# Patient Record
Sex: Female | Born: 1985
Health system: Southern US, Community
[De-identification: ages and names within clinical notes are randomized; demographics above are authoritative.]

## PROBLEM LIST (undated history)

## (undated) DIAGNOSIS — O24419 Gestational diabetes mellitus in pregnancy, unspecified control: Secondary | ICD-10-CM

## (undated) DIAGNOSIS — Z8619 Personal history of other infectious and parasitic diseases: Secondary | ICD-10-CM

## (undated) HISTORY — DX: Gestational diabetes mellitus in pregnancy, unspecified control: O24.419

## (undated) HISTORY — DX: Personal history of other infectious and parasitic diseases: Z86.19

---

## 2015-07-08 ENCOUNTER — Ambulatory Visit (INDEPENDENT_AMBULATORY_CARE_PROVIDER_SITE_OTHER): Payer: BLUE CROSS/BLUE SHIELD | Admitting: Physician Assistant

## 2015-07-08 VITALS — BP 112/80 | HR 82 | Temp 98.7°F | Resp 20 | Ht 65.0 in | Wt 129.2 lb

## 2015-07-08 DIAGNOSIS — Z13228 Encounter for screening for other metabolic disorders: Secondary | ICD-10-CM

## 2015-07-08 DIAGNOSIS — Z23 Encounter for immunization: Secondary | ICD-10-CM | POA: Diagnosis not present

## 2015-07-08 DIAGNOSIS — Z130 Encounter for screening for diseases of the blood and blood-forming organs and certain disorders involving the immune mechanism: Secondary | ICD-10-CM

## 2015-07-08 DIAGNOSIS — Z0000 Encounter for general adult medical examination without abnormal findings: Secondary | ICD-10-CM

## 2015-07-08 DIAGNOSIS — Z13220 Encounter for screening for lipoid disorders: Secondary | ICD-10-CM

## 2015-07-08 DIAGNOSIS — Z1329 Encounter for screening for other suspected endocrine disorder: Secondary | ICD-10-CM

## 2015-07-08 LAB — CBC WITH DIFFERENTIAL/PLATELET
Basophils Absolute: 0 10*3/uL (ref 0.0–0.1)
Basophils Relative: 0 % (ref 0–1)
EOS PCT: 1 % (ref 0–5)
Eosinophils Absolute: 0.1 10*3/uL (ref 0.0–0.7)
HCT: 38.5 % (ref 36.0–46.0)
HEMOGLOBIN: 12.8 g/dL (ref 12.0–15.0)
LYMPHS ABS: 2.6 10*3/uL (ref 0.7–4.0)
LYMPHS PCT: 25 % (ref 12–46)
MCH: 30 pg (ref 26.0–34.0)
MCHC: 33.2 g/dL (ref 30.0–36.0)
MCV: 90.2 fL (ref 78.0–100.0)
MONO ABS: 0.4 10*3/uL (ref 0.1–1.0)
MONOS PCT: 4 % (ref 3–12)
MPV: 9.2 fL (ref 8.6–12.4)
NEUTROS ABS: 7.3 10*3/uL (ref 1.7–7.7)
Neutrophils Relative %: 70 % (ref 43–77)
Platelets: 346 10*3/uL (ref 150–400)
RBC: 4.27 MIL/uL (ref 3.87–5.11)
RDW: 12.6 % (ref 11.5–15.5)
WBC: 10.4 10*3/uL (ref 4.0–10.5)

## 2015-07-08 LAB — COMPREHENSIVE METABOLIC PANEL
ALK PHOS: 71 U/L (ref 33–115)
ALT: 12 U/L (ref 6–29)
AST: 15 U/L (ref 10–30)
Albumin: 3.9 g/dL (ref 3.6–5.1)
BILIRUBIN TOTAL: 0.4 mg/dL (ref 0.2–1.2)
BUN: 9 mg/dL (ref 7–25)
CALCIUM: 8.8 mg/dL (ref 8.6–10.2)
CO2: 24 mmol/L (ref 20–31)
Chloride: 104 mmol/L (ref 98–110)
Creat: 0.72 mg/dL (ref 0.50–1.10)
GLUCOSE: 82 mg/dL (ref 65–99)
POTASSIUM: 3.9 mmol/L (ref 3.5–5.3)
Sodium: 139 mmol/L (ref 135–146)
Total Protein: 6.9 g/dL (ref 6.1–8.1)

## 2015-07-08 LAB — LIPID PANEL
CHOL/HDL RATIO: 4.2 ratio (ref <=5.0)
Cholesterol: 184 mg/dL (ref 125–200)
HDL: 44 mg/dL — ABNORMAL LOW (ref >=46)
LDL Cholesterol: 91 mg/dL (ref <130)
TRIGLYCERIDES: 245 mg/dL — AB (ref <150)
VLDL: 49 mg/dL — ABNORMAL HIGH (ref <30)

## 2015-07-08 LAB — TSH: TSH: 1.404 u[IU]/mL (ref 0.350–4.500)

## 2015-07-08 NOTE — Patient Instructions (Signed)
I will contact you with your lab results as soon as they are available.   If you have not heard from me in 2 weeks, please contact me.  The fastest way to get your results is to register for My Chart (see the instructions on the last page of this printout).  Keeping You Healthy  Get These Tests 1. Blood Pressure- Have your blood pressure checked once a year by your health care provider.  Normal blood pressure is 120/80. 2. Weight- Have your body mass index (BMI) calculated to screen for obesity.  BMI is measure of body fat based on height and weight.  You can also calculate your own BMI at www.nhlbisupport.com/bmi/. 3. Cholesterol- Have your cholesterol checked every 5 years starting at age 20 then yearly starting at age 45. 4. Chlamydia, HIV, and other sexually transmitted diseases- Get screened every year until age 25, then within three months of each new sexual provider. 5. Pap Test - Every 1-5 years; discuss with your health care provider. 6. Mammogram- Every 1-2 years starting at age 40--50  Take these medicines  Calcium with Vitamin D-Your body needs 1200 mg of Calcium each day and 800-1000 IU of Vitamin D daily.  Your body can only absorb 500 mg of Calcium at a time so Calcium must be taken in 2 or 3 divided doses throughout the day.  Multivitamin with folic acid- Once daily if it is possible for you to become pregnant.  Get these Immunizations  Gardasil-Series of three doses; prevents HPV related illness such as genital warts and cervical cancer.  Menactra-Single dose; prevents meningitis.  Tetanus shot- Every 10 years.  Flu shot-Every year.  Take these steps 1. Do not smoke-Your healthcare provider can help you quit.  For tips on how to quit go to www.smokefree.gov or call 1-800 QUITNOW. 2. Be physically active- Exercise 5 days a week for at least 30 minutes.  If you are not already physically active, start slow and gradually work up to 30 minutes of moderate physical  activity.  Examples of moderate activity include walking briskly, dancing, swimming, bicycling, etc. 3. Breast Cancer- A self breast exam every month is important for early detection of breast cancer.  For more information and instruction on self breast exams, ask your healthcare provider or www.womenshealth.gov/faq/breast-self-exam.cfm. 4. Eat a healthy diet- Eat a variety of healthy foods such as fruits, vegetables, whole grains, low fat milk, low fat cheeses, yogurt, lean meats, poultry and fish, beans, nuts, tofu, etc.  For more information go to www. Thenutritionsource.org 5. Drink alcohol in moderation- Limit alcohol intake to one drink or less per day. Never drink and drive. 6. Depression- Your emotional health is as important as your physical health.  If you're feeling down or losing interest in things you normally enjoy please talk to your healthcare provider about being screened for depression. 7. Dental visit- Brush and floss your teeth twice daily; visit your dentist twice a year. 8. Eye doctor- Get an eye exam at least every 2 years. 9. Helmet use- Always wear a helmet when riding a bicycle, motorcycle, rollerblading or skateboarding. 10. Safe sex- If you may be exposed to sexually transmitted infections, use a condom. 11. Seat belts- Seat belts can save your live; always wear one. 12. Smoke/Carbon Monoxide detectors- These detectors need to be installed on the appropriate level of your home. Replace batteries at least once a year. 13. Skin cancer- When out in the sun please cover up and use sunscreen 15 SPF   or higher. 14. Violence- If anyone is threatening or hurting you, please tell your healthcare provider.        

## 2015-07-08 NOTE — Progress Notes (Signed)
Patient ID: Kelsey Cole, female    DOB: 06-29-1986, 29 y.o.   MRN: [id number]  PCP: No primary care provider on file.  Chief Complaint  Patient presents with  . Annual Exam    Subjective:   HPI: Presents for Annual Wellness Exam.  Sees Dr. Renaldo Fiddler at [facility]. Her GYN visit there is scheduled for next month.  Was seen elsewhere recently with epigastric abdominal pain and increased belching. Those symptoms have improved.  Had an episode of chills, nausea with rapid position change after prolonged stooped position about one month ago. She felt bad for about an hour, rested and they symptoms resolved without recurrence.  There are no active problems to display for this patient.   History reviewed. No pertinent past medical history.   Prior to Admission medications   Medication Sig Start Date End Date Taking? Authorizing Provider  levonorgestrel-ethinyl estradiol (NORDETTE) 0.15-30 MG-MCG tablet Take 1 tablet by mouth daily.   Yes Historical Provider, MD    No Known Allergies  History reviewed. No pertinent past surgical history.  Family History  Problem Relation Age of Onset  . Hypertension Mother   . Diabetes Father   . Hypertension Paternal Uncle     Social History   Social History  . Marital Status: Married    Spouse Name: Sima Matas  . Number of Children: 1  . Years of Education: college   Occupational History  . stay at home mom    Social History Main Topics  . Smoking status: Never Smoker   . Smokeless tobacco: Never Used  . Alcohol Use: No  . Drug Use: No  . Sexual Activity:    Partners: Male    Birth Control/ Protection: Pill   Other Topics Concern  . None   Social History Narrative   From [place]. Came to the [place] in 2011.   Lives with her husband and their son (born 2013).       Review of Systems  Constitutional: Negative.   HENT: Negative.   Eyes: Negative.   Respiratory: Negative.   Cardiovascular: Negative.     Gastrointestinal: Negative.   Endocrine: Negative.   Genitourinary: Negative.   Musculoskeletal: Negative.   Skin: Negative.   Allergic/Immunologic: Negative.   Neurological: Negative.   Hematological: Negative.   Psychiatric/Behavioral: Negative.         Objective:  Physical Exam  Constitutional: She is oriented to person, place, and time. Vital signs are normal. She appears well-developed and well-nourished. She is active and cooperative. No distress.  BP 112/80 mmHg  Pulse 82  Temp(Src) 98.7 F (37.1 C) (Oral)  Resp 20  Ht  (1.651 m)  Wt 129 lb 3.2 oz (58.605 kg)  BMI 21.50 kg/m2  SpO2 99%  LMP 06/21/2015   HENT:  Head: Normocephalic and atraumatic.  Right Ear: Hearing, tympanic membrane, external ear and ear canal normal. No foreign bodies.  Left Ear: Hearing, tympanic membrane, external ear and ear canal normal. No foreign bodies.  Nose: Nose normal.  Mouth/Throat: Uvula is midline, oropharynx is clear and moist and mucous membranes are normal. No oral lesions. Normal dentition. No dental abscesses or uvula swelling. No oropharyngeal exudate.  Eyes: Conjunctivae, EOM and lids are normal. Pupils are equal, round, and reactive to light. Right eye exhibits no discharge. Left eye exhibits no discharge. No scleral icterus.  Fundoscopic exam:      The right eye shows no arteriolar narrowing, no AV nicking, no exudate, no hemorrhage and no  papilledema. The right eye shows red reflex.       The left eye shows no arteriolar narrowing, no AV nicking, no exudate, no hemorrhage and no papilledema. The left eye shows red reflex.  Neck: Trachea normal, normal range of motion and full passive range of motion without pain. Neck supple. No spinous process tenderness and no muscular tenderness present. No thyroid mass and no thyromegaly present.  Cardiovascular: Normal rate, regular rhythm, normal heart sounds, intact distal pulses and normal pulses.   Pulmonary/Chest: Effort normal  and breath sounds normal.  Musculoskeletal: She exhibits no edema or tenderness.       Cervical back: Normal.       Thoracic back: Normal.       Lumbar back: Normal.  Lymphadenopathy:       Head (right side): No tonsillar, no preauricular, no posterior auricular and no occipital adenopathy present.       Head (left side): No tonsillar, no preauricular, no posterior auricular and no occipital adenopathy present.    She has no cervical adenopathy.       Right: No supraclavicular adenopathy present.       Left: No supraclavicular adenopathy present.  Neurological: She is alert and oriented to person, place, and time. She has normal strength and normal reflexes. No cranial nerve deficit. She exhibits normal muscle tone. Coordination and gait normal.  Skin: Skin is warm, dry and intact. No rash noted. She is not diaphoretic. No cyanosis or erythema. Nails show no clubbing.  Psychiatric: She has a normal mood and affect. Her speech is normal and behavior is normal. Judgment and thought content normal.           Assessment & Plan:  1. Annual physical exam Proceed with GYN exam with Dr. Renaldo FiddlerAdkins as planned next month. Age appropriate anticipatory guidance provided.  2. Need for influenza vaccination - Flu Vaccine QUAD 36+ mos IM  3. Screening for deficiency anemia - CBC with Differential/Platelet  4. Screening for metabolic disorder - Comprehensive metabolic panel  5. Screening for thyroid disorder - TSH  6. Screening for hyperlipidemia - Lipid panel   Fernande Brashelle S. Capri Veals, PA-C Physician Assistant-Certified [facility]

## 2015-07-08 NOTE — Addendum Note (Signed)
Addended by: Fernande BrasJEFFERY, Gervase Colberg S on: 07/08/2015 11:31 AM   Modules accepted: Level of Service

## 2016-11-14 DIAGNOSIS — Z01419 Encounter for gynecological examination (general) (routine) without abnormal findings: Secondary | ICD-10-CM | POA: Diagnosis not present

## 2016-11-14 DIAGNOSIS — Z6823 Body mass index (BMI) 23.0-23.9, adult: Secondary | ICD-10-CM | POA: Diagnosis not present

## 2017-05-26 DIAGNOSIS — Z23 Encounter for immunization: Secondary | ICD-10-CM | POA: Diagnosis not present

## 2017-12-12 DIAGNOSIS — Z803 Family history of malignant neoplasm of breast: Secondary | ICD-10-CM | POA: Diagnosis not present

## 2017-12-12 DIAGNOSIS — Z6825 Body mass index (BMI) 25.0-25.9, adult: Secondary | ICD-10-CM | POA: Diagnosis not present

## 2017-12-12 DIAGNOSIS — Z01419 Encounter for gynecological examination (general) (routine) without abnormal findings: Secondary | ICD-10-CM | POA: Diagnosis not present

## 2018-02-02 DIAGNOSIS — Z809 Family history of malignant neoplasm, unspecified: Secondary | ICD-10-CM | POA: Diagnosis not present

## 2018-10-06 ENCOUNTER — Ambulatory Visit: Payer: Self-pay | Admitting: Osteopathic Medicine

## 2018-10-15 ENCOUNTER — Ambulatory Visit (INDEPENDENT_AMBULATORY_CARE_PROVIDER_SITE_OTHER): Payer: BLUE CROSS/BLUE SHIELD | Admitting: Osteopathic Medicine

## 2018-10-15 ENCOUNTER — Encounter: Payer: Self-pay | Admitting: Osteopathic Medicine

## 2018-10-15 ENCOUNTER — Other Ambulatory Visit (HOSPITAL_COMMUNITY)
Admission: RE | Admit: 2018-10-15 | Discharge: 2018-10-15 | Disposition: A | Discharge status: Home / Self Care | Payer: BLUE CROSS/BLUE SHIELD | Source: Ambulatory Visit | Attending: Osteopathic Medicine | Admitting: Osteopathic Medicine

## 2018-10-15 VITALS — BP 116/76 | HR 78 | Temp 98.5°F | Wt 147.4 lb

## 2018-10-15 DIAGNOSIS — Z124 Encounter for screening for malignant neoplasm of cervix: Secondary | ICD-10-CM

## 2018-10-15 DIAGNOSIS — Z8619 Personal history of other infectious and parasitic diseases: Secondary | ICD-10-CM | POA: Diagnosis not present

## 2018-10-15 DIAGNOSIS — Z0000 Encounter for general adult medical examination without abnormal findings: Secondary | ICD-10-CM | POA: Diagnosis not present

## 2018-10-15 DIAGNOSIS — R05 Cough: Secondary | ICD-10-CM

## 2018-10-15 DIAGNOSIS — Z113 Encounter for screening for infections with a predominantly sexual mode of transmission: Secondary | ICD-10-CM | POA: Diagnosis not present

## 2018-10-15 DIAGNOSIS — R058 Other specified cough: Secondary | ICD-10-CM

## 2018-10-15 LAB — WET PREP FOR TRICH, YEAST, CLUE
MICRO NUMBER:: 191517
Specimen Quality: ADEQUATE

## 2018-10-15 NOTE — Progress Notes (Addendum)
HPI: Kelsey Cole is a 33 y.o. female who  has no past medical history on file.  she presents to [facility] today, 10/15/18,  for chief complaint of: New to establish  See headings below  GU/reproductive: Review of systems positive for genital discharge, genital rash, abnormal periods.    Takes birth control pills x9 years, happy w/ this method.    She reports that she is interested in being screened for sexually transmitted infections today, last tested last year.  History of HSV, (+)serology but never blistering rash; partner has tested (+) as well.    Reports periods have been normal/regular but lately has been spotting a week or so after her period   Reports vaginal itching/irritation, no blisters or discrete lesions.   G1P1 w/o complications in pregnancy/delivery.   Respiratory: Reports cough, sometimes dry, sometimes productive. Lingering after a cold 2 weeks ago.   No past medical history or other medications besides birth control and vitamins.  No allergies.  Never smoker, rare alcohol use. No surgeries. Reports UTD flu shot and tetanus shot.      Past medical, surgical, social and family history reviewed:  Patient Active Problem List   Diagnosis Date Noted  . History of ELISA positive for HSV 10/16/2018   History reviewed. No pertinent surgical history.  Social History   Tobacco Use  . Smoking status: Never Smoker  . Smokeless tobacco: Never Used  Substance Use Topics  . Alcohol use: Never    Alcohol/week: 0.0 standard drinks    Frequency: Never    Family History  Problem Relation Age of Onset  . Hypertension Mother   . Diabetes Father   . Hypertension Paternal Uncle      Current medication list and allergy/intolerance information reviewed:    Current Outpatient Medications  Medication Sig Dispense Refill  . levonorgestrel-ethinyl estradiol (NORDETTE) 0.15-30 MG-MCG tablet Take 1 tablet by mouth daily.    .  Multiple Vitamins-Minerals (MULTIVITAMIN ADULT) TABS Take by mouth.     No current facility-administered medications for this visit.     No Known Allergies    Review of Systems:  Constitutional:  No  fever, no chills, No recent illness, No unintentional weight changes. No significant fatigue.   HEENT: No  headache, no vision change, no hearing change, No sore throat, No  sinus pressure  Cardiac: No  chest pain, No  pressure, No palpitations, No  Orthopnea  Respiratory:  No  shortness of breath. +Cough  Gastrointestinal: No  abdominal pain, No  nausea, No  vomiting,  No  blood in stool, No  diarrhea, No  constipation   Musculoskeletal: No new myalgia/arthralgia  Skin: +Rash, No other wounds/concerning lesions  Genitourinary: No  incontinence, +abnormal genital bleeding, No abnormal genital discharge  Hem/Onc: No  easy bruising/bleeding, No  abnormal lymph node  Endocrine: No cold intolerance,  No heat intolerance. No polyuria/polydipsia/polyphagia   Neurologic: No  weakness, No  dizziness, No  slurred speech/focal weakness/facial droop  Psychiatric: No  concerns with depression, No  concerns with anxiety, No sleep problems, No mood problems  Exam:  BP 116/76 (BP Location: Left Arm, Patient Position: Sitting, Cuff Size: Normal)   Pulse 78   Temp 98.5 F (36.9 C) (Oral)   Wt 147 lb 6.4 oz (66.9 kg)   BMI 24.53 kg/m   Constitutional: VS see above. General Appearance: alert, well-developed, well-nourished, NAD  Eyes: Normal lids and conjunctive, non-icteric sclera  Ears, Nose, Mouth, Throat: MMM, Normal  external inspection ears/nares/mouth/lips/gums.   Neck: No masses, trachea midline. No thyroid enlargement. No tenderness/mass appreciated. No lymphadenopathy  Respiratory: Normal respiratory effort. no wheeze, no rhonchi, no rales  Cardiovascular: S1/S2 normal, no murmur, no rub/gallop auscultated. RRR. No lower extremity edema.   Musculoskeletal: Gait normal.   Psychiatric: Normal judgment/insight. Normal mood and affect. Oriented x3.  GYN: No lesions/ulcers to external genitalia, normal urethra, normal vaginal mucosa, physiologic discharge, cervix normal without lesions, uterus not enlarged or tender, adnexa no masses and nontender  BREAST: No rashes/skin changes, normal fibrous breast tissue, no masses or tenderness, normal nipple without discharge, normal axilla    Results for orders placed or performed in visit on 10/15/18 (from the past 72 hour(s))  WET PREP FOR TRICH, YEAST, CLUE     Status: None   Collection Time: 10/15/18 11:56 AM  Result Value Ref Range   MICRO NUMBER: [id number]    Specimen Quality Adequate    SOURCE: NOT GIVEN    Status FINAL    RESULT      No Trichomonas vaginalis seen. No yeast seen No clue cells seen Epithelial Cells Present  C. trachomatis/N. gonorrhoeae RNA     Status: None   Collection Time: 10/15/18 11:56 AM  Result Value Ref Range   C. trachomatis RNA, TMA NOT DETECTED NOT DETECT   N. gonorrhoeae RNA, TMA NOT DETECTED NOT DETECT    Comment: The analytical performance characteristics of this assay, when used to test SurePath(TM) specimens have been determined by [employer]. The modifications have not been cleared or approved by the FDA. This assay has been validated pursuant to the CLIA regulations and is used for clinical purposes. . For additional information, please refer to https://education.questdiagnostics.com/faq/FAQ154 (This link is being provided for information/ educational purposes only.) .   CBC     Status: Abnormal   Collection Time: 10/15/18 12:10 PM  Result Value Ref Range   WBC 11.4 (H) 3.8 - 10.8 Thousand/uL   RBC 4.47 3.80 - 5.10 Million/uL   Hemoglobin 13.5 11.7 - 15.5 g/dL   HCT 09.3 23.5 - 57.3 %   MCV 90.6 80.0 - 100.0 fL   MCH 30.2 27.0 - 33.0 pg   MCHC 33.3 32.0 - 36.0 g/dL   RDW 22.0 25.4 - 27.0 %   Platelets 342 140 - 400 Thousand/uL   MPV 10.0 7.5 - 12.5 fL   COMPLETE METABOLIC PANEL WITH GFR     Status: None   Collection Time: 10/15/18 12:10 PM  Result Value Ref Range   Glucose, Bld 89 65 - 99 mg/dL    Comment: .            Fasting reference interval .    BUN 10 7 - 25 mg/dL   Creat 6.23 7.62 - 8.31 mg/dL   GFR, Est Non African American 116 > OR = 60 mL/min/1.9m2   GFR, Est African American 135 > OR = 60 mL/min/1.83m2   BUN/Creatinine Ratio NOT APPLICABLE 6 - 22 (calc)   Sodium 142 135 - 146 mmol/L   Potassium 3.6 3.5 - 5.3 mmol/L   Chloride 104 98 - 110 mmol/L   CO2 27 20 - 32 mmol/L   Calcium 9.4 8.6 - 10.2 mg/dL   Total Protein 7.3 6.1 - 8.1 g/dL   Albumin 4.3 3.6 - 5.1 g/dL   Globulin 3.0 1.9 - 3.7 g/dL (calc)   AG Ratio 1.4 1.0 - 2.5 (calc)   Total Bilirubin 0.4 0.2 - 1.2 mg/dL  Alkaline phosphatase (APISO) 97 31 - 125 U/L   AST 15 10 - 30 U/L   ALT 13 6 - 29 U/L  Lipid panel     Status: Abnormal   Collection Time: 10/15/18 12:10 PM  Result Value Ref Range   Cholesterol 197 <200 mg/dL   HDL 49 (L) > OR = 50 mg/dL   Triglycerides 027 (H) <150 mg/dL    Comment: . If a non-fasting specimen was collected, consider repeat triglyceride testing on a fasting specimen if clinically indicated.  Perry Mount et al. J. of Clin. Lipidol. 2015;9:129-169. Marland Kitchen    LDL Cholesterol (Calc) 112 (H) mg/dL (calc)    Comment: Reference range: <100 . Desirable range <100 mg/dL for primary prevention;   <70 mg/dL for patients with CHD or diabetic patients  with > or = 2 CHD risk factors. Marland Kitchen LDL-C is now calculated using the Martin-Hopkins  calculation, which is a validated novel method providing  better accuracy than the Friedewald equation in the  estimation of LDL-C.  Horald Pollen et al. Lenox Ahr. 7412;878(67): 2061-2068  (http://education.[facility].com/faq/FAQ164)    Total CHOL/HDL Ratio 4.0 <5.0 (calc)   Non-HDL Cholesterol (Calc) 148 (H) <130 mg/dL (calc)    Comment: For patients with diabetes plus 1 major ASCVD risk  factor,  treating to a non-HDL-C goal of <100 mg/dL  (LDL-C of <67 mg/dL) is considered a therapeutic  option.   Hepatitis C antibody     Status: None   Collection Time: 10/15/18 12:10 PM  Result Value Ref Range   Hepatitis C Ab NON-REACTIVE NON-REACTI   SIGNAL TO CUT-OFF 0.01 <1.00    Comment: . HCV antibody was non-reactive. There is no laboratory  evidence of HCV infection. . In most cases, no further action is required. However, if recent HCV exposure is suspected, a test for HCV RNA (test code 20947) is suggested. . For additional information please refer to http://education.questdiagnostics.com/faq/FAQ22v1 (This link is being provided for informational/ educational purposes only.) .   RPR     Status: None   Collection Time: 10/15/18 12:10 PM  Result Value Ref Range   RPR Ser Ql NON-REACTIVE NON-REACTI  Hepatitis B surface antigen     Status: None   Collection Time: 10/15/18 12:10 PM  Result Value Ref Range   Hepatitis B Surface Ag NON-REACTIVE NON-REACTI  Hepatitis B core antibody, total     Status: None   Collection Time: 10/15/18 12:10 PM  Result Value Ref Range   Hep B Core Total Ab NON-REACTIVE NON-REACTI    No results found.          ASSESSMENT/PLAN: The primary encounter diagnosis was History of ELISA positive for HSV. Diagnoses of Routine screening for STI (sexually transmitted infection), Annual physical exam, Cervical cancer screening, and Post-viral cough syndrome were also pertinent to this visit.  Please note: labs ordered for future annual, preventive care was not the primary diagnosis or billing code today.   Orders Placed This Encounter  Procedures  . WET PREP FOR TRICH, YEAST, CLUE  . C. trachomatis/N. gonorrhoeae RNA  . CBC  . COMPLETE METABOLIC PANEL WITH GFR  . Lipid panel  . HIV Antibody (routine testing w rflx)  . Hepatitis C antibody  . RPR  . Hepatitis B surface antigen  . Hepatitis B core antibody, total  . HSV(herpes simplex vrs) 1+2  ab-IgG  . HSV(herpes simplex vrs) 1+2 ab-IgG        Visit summary with medication list and pertinent instructions was printed for  patient to review. All questions at time of visit were answered - patient instructed to contact office with any additional concerns or updates. ER/RTC precautions were reviewed with the patient.    Please note: voice recognition software was used to produce this document, and typos may escape review. Please contact Dr. Lyn HollingsheadAlexander for any needed clarifications.     Follow-up plan: Return for keep appointment for physical - we can talk about lab results at that appointment .

## 2018-10-16 DIAGNOSIS — Z8619 Personal history of other infectious and parasitic diseases: Secondary | ICD-10-CM | POA: Insufficient documentation

## 2018-10-16 LAB — LIPID PANEL
CHOLESTEROL: 197 mg/dL (ref <200)
HDL: 49 mg/dL — ABNORMAL LOW (ref >=50)
LDL CHOLESTEROL (CALC): 112 mg/dL — AB
Non-HDL Cholesterol (Calc): 148 mg/dL (calc) — ABNORMAL HIGH (ref <130)
TRIGLYCERIDES: 238 mg/dL — AB (ref <150)
Total CHOL/HDL Ratio: 4 (calc) (ref <5.0)

## 2018-10-16 LAB — COMPLETE METABOLIC PANEL WITH GFR
AG RATIO: 1.4 (calc) (ref 1.0–2.5)
ALKALINE PHOSPHATASE (APISO): 97 U/L (ref 31–125)
ALT: 13 U/L (ref 6–29)
AST: 15 U/L (ref 10–30)
Albumin: 4.3 g/dL (ref 3.6–5.1)
BILIRUBIN TOTAL: 0.4 mg/dL (ref 0.2–1.2)
BUN: 10 mg/dL (ref 7–25)
CHLORIDE: 104 mmol/L (ref 98–110)
CO2: 27 mmol/L (ref 20–32)
Calcium: 9.4 mg/dL (ref 8.6–10.2)
Creat: 0.64 mg/dL (ref 0.50–1.10)
GFR, EST AFRICAN AMERICAN: 135 mL/min/{1.73_m2} (ref >=60)
GFR, Est Non African American: 116 mL/min/{1.73_m2} (ref >=60)
GLUCOSE: 89 mg/dL (ref 65–99)
Globulin: 3 g/dL (calc) (ref 1.9–3.7)
Potassium: 3.6 mmol/L (ref 3.5–5.3)
Sodium: 142 mmol/L (ref 135–146)
Total Protein: 7.3 g/dL (ref 6.1–8.1)

## 2018-10-16 LAB — CBC
HCT: 40.5 % (ref 35.0–45.0)
Hemoglobin: 13.5 g/dL (ref 11.7–15.5)
MCH: 30.2 pg (ref 27.0–33.0)
MCHC: 33.3 g/dL (ref 32.0–36.0)
MCV: 90.6 fL (ref 80.0–100.0)
MPV: 10 fL (ref 7.5–12.5)
PLATELETS: 342 10*3/uL (ref 140–400)
RBC: 4.47 10*6/uL (ref 3.80–5.10)
RDW: 12.1 % (ref 11.0–15.0)
WBC: 11.4 10*3/uL — ABNORMAL HIGH (ref 3.8–10.8)

## 2018-10-16 LAB — HEPATITIS C ANTIBODY
Hepatitis C Ab: NONREACTIVE
SIGNAL TO CUT-OFF: 0.01 (ref <1.00)

## 2018-10-16 LAB — RPR: RPR Ser Ql: NONREACTIVE

## 2018-10-16 LAB — HEPATITIS B SURFACE ANTIGEN: Hepatitis B Surface Ag: NONREACTIVE

## 2018-10-16 LAB — HSV(HERPES SIMPLEX VRS) I + II AB-IGG
HAV 1 IGG,TYPE SPECIFIC AB: >58 index — ABNORMAL HIGH
HSV 2 IGG,TYPE SPECIFIC AB: <0.9 index

## 2018-10-16 LAB — HEPATITIS B CORE ANTIBODY, TOTAL: Hep B Core Total Ab: NONREACTIVE

## 2018-10-16 LAB — C. TRACHOMATIS/N. GONORRHOEAE RNA
C. trachomatis RNA, TMA: NOT DETECTED
N. gonorrhoeae RNA, TMA: NOT DETECTED

## 2018-10-16 LAB — HIV ANTIBODY (ROUTINE TESTING W REFLEX): HIV: NONREACTIVE

## 2018-10-19 LAB — CYTOLOGY - PAP
Diagnosis: NEGATIVE
HPV: NOT DETECTED

## 2018-10-21 ENCOUNTER — Ambulatory Visit (INDEPENDENT_AMBULATORY_CARE_PROVIDER_SITE_OTHER): Payer: BLUE CROSS/BLUE SHIELD | Admitting: Osteopathic Medicine

## 2018-10-21 ENCOUNTER — Encounter: Payer: Self-pay | Admitting: Osteopathic Medicine

## 2018-10-21 VITALS — BP 107/66 | HR 85 | Temp 98.6°F | Wt 145.6 lb

## 2018-10-21 DIAGNOSIS — Z0000 Encounter for general adult medical examination without abnormal findings: Secondary | ICD-10-CM | POA: Diagnosis not present

## 2018-10-21 DIAGNOSIS — Z8619 Personal history of other infectious and parasitic diseases: Secondary | ICD-10-CM

## 2018-10-21 MED ORDER — LEVONORGESTREL-ETHINYL ESTRAD 0.15-30 MG-MCG PO TABS: 1.0000 | ORAL_TABLET | Freq: Every day | ORAL | 4 refills | Status: DC

## 2018-10-21 NOTE — Progress Notes (Signed)
HPI: Kelsey Cole is a 33 y.o. female who  has no past medical history on file.  she presents to [facility] today, 10/21/18,  for chief complaint of: Annual Physical    Patient here for annual physical / wellness exam.  See preventive care reviewed as below.  Recent labs reviewed in detail with the patient.   Elevated TG, pt reports this is chronic  WBC elevated likely d/t recent illness  (+)HSV1 Ab, never had outbreak   Additional concerns today include:  Episode fo dizziness a few days ago, none before or since, happened w/ sitting up quickly    Past medical, surgical, social and family history reviewed:  Patient Active Problem List   Diagnosis Date Noted  . History of ELISA positive for HSV 10/16/2018    No past surgical history on file.  Social History   Tobacco Use  . Smoking status: Never Smoker  . Smokeless tobacco: Never Used  Substance Use Topics  . Alcohol use: Never    Alcohol/week: 0.0 standard drinks    Frequency: Never    Family History  Problem Relation Age of Onset  . Hypertension Mother   . Diabetes Father   . Hypertension Paternal Uncle      Current medication list and allergy/intolerance information reviewed:    Current Outpatient Medications  Medication Sig Dispense Refill  . levonorgestrel-ethinyl estradiol (NORDETTE) 0.15-30 MG-MCG tablet Take 1 tablet by mouth daily. 3 Package 4  . Multiple Vitamins-Minerals (MULTIVITAMIN ADULT) TABS Take by mouth.     No current facility-administered medications for this visit.     No Known Allergies    Review of Systems:  Constitutional:  No  fever, no chills, No recent illness, No unintentional weight changes. No significant fatigue.   HEENT: No  headache, no vision change, no hearing change, No sore throat, No  sinus pressure  Cardiac: No  chest pain, No  pressure, No palpitations, No  Orthopnea  Respiratory:  No  shortness of breath. No   Cough  Gastrointestinal: No  abdominal pain, No  nausea, No  vomiting,  No  blood in stool, No  diarrhea, No  constipation   Musculoskeletal: No new myalgia/arthralgia  Skin: No  Rash, No other wounds/concerning lesions  Genitourinary: No  incontinence, No  abnormal genital bleeding, No abnormal genital discharge  Hem/Onc: No  easy bruising/bleeding, No  abnormal lymph node  Endocrine: No cold intolerance,  No heat intolerance. No polyuria/polydipsia/polyphagia   Neurologic: No  weakness, No  dizziness, No  slurred speech/focal weakness/facial droop  Psychiatric: No  concerns with depression, No  concerns with anxiety, No sleep problems, No mood problems  Exam:  BP 107/66 (BP Location: Left Arm, Patient Position: Sitting, Cuff Size: Normal)   Pulse 85   Temp 98.6 F (37 C) (Oral)   Wt 145 lb 9.6 oz (66 kg)   BMI 24.23 kg/m   Constitutional: VS see above. General Appearance: alert, well-developed, well-nourished, NAD  Eyes: Normal lids and conjunctive, non-icteric sclera  Ears, Nose, Mouth, Throat: MMM, Normal external inspection ears/nares/mouth/lips/gums. TM normal bilaterally. Pharynx/tonsils no erythema, no exudate. Nasal mucosa normal.   Neck: No masses, trachea midline. No thyroid enlargement. No tenderness/mass appreciated. No lymphadenopathy  Respiratory: Normal respiratory effort. no wheeze, no rhonchi, no rales  Cardiovascular: S1/S2 normal, no murmur, no rub/gallop auscultated. RRR. No lower extremity edema. Pedal pulse II/IV bilaterally DP and PT. No carotid bruit or JVD. No abdominal aortic bruit.  Gastrointestinal: Nontender, no  masses. No hepatomegaly, no splenomegaly. No hernia appreciated. Bowel sounds normal. Rectal exam deferred.   Musculoskeletal: Gait normal. No clubbing/cyanosis of digits.   Neurological: Normal balance/coordination. No tremor. No cranial nerve deficit on limited exam. Motor and sensation intact and symmetric. Cerebellar reflexes  intact.   Skin: warm, dry, intact. No rash/ulcer. No concerning nevi or subq nodules on limited exam.    Psychiatric: Normal judgment/insight. Normal mood and affect. Oriented x3.   Recent Results (from the past 2160 hour(s))  Cytology - PAP     Status: None   Collection Time: 10/15/18 12:00 AM  Result Value Ref Range   Adequacy      Satisfactory for evaluation  endocervical/transformation zone component PRESENT.   Diagnosis      NEGATIVE FOR INTRAEPITHELIAL LESIONS OR MALIGNANCY. BENIGN REACTIVE/REPARATIVE CHANGES.   HPV NOT DETECTED     Comment: Normal Reference Range - NOT Detected   Material Submitted CervicoVaginal Pap [ThinPrep Imaged]    CYTOLOGY - PAP PAP RESULT   WET PREP FOR TRICH, YEAST, CLUE     Status: None   Collection Time: 10/15/18 11:56 AM  Result Value Ref Range   MICRO NUMBER: [id number]    Specimen Quality Adequate    SOURCE: NOT GIVEN    Status FINAL    RESULT      No Trichomonas vaginalis seen. No yeast seen No clue cells seen Epithelial Cells Present  C. trachomatis/N. gonorrhoeae RNA     Status: None   Collection Time: 10/15/18 11:56 AM  Result Value Ref Range   C. trachomatis RNA, TMA NOT DETECTED NOT DETECT   N. gonorrhoeae RNA, TMA NOT DETECTED NOT DETECT    Comment: The analytical performance characteristics of this assay, when used to test SurePath(TM) specimens have been determined by [employer]. The modifications have not been cleared or approved by the FDA. This assay has been validated pursuant to the CLIA regulations and is used for clinical purposes. . For additional information, please refer to https://education.[facility].com/faq/FAQ154 (This link is being provided for information/ educational purposes only.) .   CBC     Status: Abnormal   Collection Time: 10/15/18 12:10 PM  Result Value Ref Range   WBC 11.4 (H) 3.8 - 10.8 Thousand/uL   RBC 4.47 3.80 - 5.10 Million/uL   Hemoglobin 13.5 11.7 - 15.5 g/dL   HCT 54.0 98.1  - 19.1 %   MCV 90.6 80.0 - 100.0 fL   MCH 30.2 27.0 - 33.0 pg   MCHC 33.3 32.0 - 36.0 g/dL   RDW 47.8 29.5 - 62.1 %   Platelets 342 140 - 400 Thousand/uL   MPV 10.0 7.5 - 12.5 fL  COMPLETE METABOLIC PANEL WITH GFR     Status: None   Collection Time: 10/15/18 12:10 PM  Result Value Ref Range   Glucose, Bld 89 65 - 99 mg/dL    Comment: .            Fasting reference interval .    BUN 10 7 - 25 mg/dL   Creat 3.08 6.57 - 8.46 mg/dL   GFR, Est Non African American 116 > OR = 60 mL/min/1.106m2   GFR, Est African American 135 > OR = 60 mL/min/1.72m2   BUN/Creatinine Ratio NOT APPLICABLE 6 - 22 (calc)   Sodium 142 135 - 146 mmol/L   Potassium 3.6 3.5 - 5.3 mmol/L   Chloride 104 98 - 110 mmol/L   CO2 27 20 - 32 mmol/L   Calcium 9.4 8.6 -  10.2 mg/dL   Total Protein 7.3 6.1 - 8.1 g/dL   Albumin 4.3 3.6 - 5.1 g/dL   Globulin 3.0 1.9 - 3.7 g/dL (calc)   AG Ratio 1.4 1.0 - 2.5 (calc)   Total Bilirubin 0.4 0.2 - 1.2 mg/dL   Alkaline phosphatase (APISO) 97 31 - 125 U/L   AST 15 10 - 30 U/L   ALT 13 6 - 29 U/L  Lipid panel     Status: Abnormal   Collection Time: 10/15/18 12:10 PM  Result Value Ref Range   Cholesterol 197 <200 mg/dL   HDL 49 (L) > OR = 50 mg/dL   Triglycerides 161 (H) <150 mg/dL    Comment: . If a non-fasting specimen was collected, consider repeat triglyceride testing on a fasting specimen if clinically indicated.  Perry Mount et al. J. of Clin. Lipidol. 2015;9:129-169. Marland Kitchen    LDL Cholesterol (Calc) 112 (H) mg/dL (calc)    Comment: Reference range: <100 . Desirable range <100 mg/dL for primary prevention;   <70 mg/dL for patients with CHD or diabetic patients  with > or = 2 CHD risk factors. Marland Kitchen LDL-C is now calculated using the Martin-Hopkins  calculation, which is a validated novel method providing  better accuracy than the Friedewald equation in the  estimation of LDL-C.  Horald Pollen et al. Lenox Ahr. 0960;454(09): 2061-2068   (http://education.[facility].com/faq/FAQ164)    Total CHOL/HDL Ratio 4.0 <5.0 (calc)   Non-HDL Cholesterol (Calc) 148 (H) <130 mg/dL (calc)    Comment: For patients with diabetes plus 1 major ASCVD risk  factor, treating to a non-HDL-C goal of <100 mg/dL  (LDL-C of <81 mg/dL) is considered a therapeutic  option.   HIV Antibody (routine testing w rflx)     Status: None   Collection Time: 10/15/18 12:10 PM  Result Value Ref Range   HIV 1&2 Ab, 4th Generation NON-REACTIVE NON-REACTI    Comment: HIV-1 antigen and HIV-1/HIV-2 antibodies were not detected. There is no laboratory evidence of HIV infection. Marland Kitchen PLEASE NOTE: This information has been disclosed to you from records whose confidentiality may be protected by state law.  If your state requires such protection, then the state law prohibits you from making any further disclosure of the information without the specific written consent of the person to whom it pertains, or as otherwise permitted by law. A general authorization for the release of medical or other information is NOT sufficient for this purpose. . For additional information please refer to http://education.[facility].com/faq/FAQ106 (This link is being provided for informational/ educational purposes only.) . Marland Kitchen The performance of this assay has not been clinically validated in patients less than 24 years old. .   Hepatitis C antibody     Status: None   Collection Time: 10/15/18 12:10 PM  Result Value Ref Range   Hepatitis C Ab NON-REACTIVE NON-REACTI   SIGNAL TO CUT-OFF 0.01 <1.00    Comment: . HCV antibody was non-reactive. There is no laboratory  evidence of HCV infection. . In most cases, no further action is required. However, if recent HCV exposure is suspected, a test for HCV RNA (test code 19147) is suggested. . For additional information please refer to http://education.[facility].com/faq/FAQ22v1 (This link is being provided for  informational/ educational purposes only.) .   RPR     Status: None   Collection Time: 10/15/18 12:10 PM  Result Value Ref Range   RPR Ser Ql NON-REACTIVE NON-REACTI  Hepatitis B surface antigen     Status: None   Collection Time:  10/15/18 12:10 PM  Result Value Ref Range   Hepatitis B Surface Ag NON-REACTIVE NON-REACTI  Hepatitis B core antibody, total     Status: None   Collection Time: 10/15/18 12:10 PM  Result Value Ref Range   Hep B Core Total Ab NON-REACTIVE NON-REACTI  HSV(herpes simplex vrs) 1+2 ab-IgG     Status: Abnormal   Collection Time: 10/15/18 12:10 PM  Result Value Ref Range   HAV 1 IGG,TYPE SPECIFIC AB >58.00 (H) index   HSV 2 IGG,TYPE SPECIFIC AB <0.90 index    Comment:                           Index          Interpretation                           -----          --------------                           <0.90          Negative                           0.90-1.09      Equivocal                           >1.09          Positive . This assay utilizes recombinant type-specific antigens to differentiate HSV-1 from HSV-2 infections. A positive result cannot distinguish between recent and past infection. If recent HSV infection is suspected but the results are negative or equivocal, the assay should be repeated in 4-6 weeks. The performance characteristics of the assay have not been established for pediatric populations, immunocompromised patients, or neonatal screening.      ASSESSMENT/PLAN: The primary encounter diagnosis was Annual physical exam. A diagnosis of History of ELISA positive for HSV was also pertinent to this visit.   Pt's partner has hx HSV outbreak, is on antivirals. Pt is advised she is potentially infectious but since never had an outbreak her likelihood is low. Could consider antivirals.   Sounds like episode of orthostatic dizziness, ok to monitor.      Meds ordered this encounter  Medications  . levonorgestrel-ethinyl estradiol  (NORDETTE) 0.15-30 MG-MCG tablet    Sig: Take 1 tablet by mouth daily.    Dispense:  3 Package    Refill:  4    Patient Instructions  General Preventive Care  Most recent routine screening lipids/other labs: reviewed today  Everyone should have blood pressure checked once per year.   Tobacco: don't!   Alcohol: responsible moderation is ok for most adults, if desired.   Exercise: as tolerated to reduce risk of cardiovascular disease and diabetes. Strength training will also prevent osteoporosis.   Mental health: if need for mental health care (medicines, counseling, other), or concerns about moods, please let me know!   Sexual health: if need for STD testing, or if concerns with libido/pain problems, please let me know! If you need to discuss your birth control options, please let me know!   Advanced Directive: Living Will and/or Healthcare Power of Attorney recommended for all adults, regardless of age or health.  Vaccines  Flu vaccine: recommended for almost everyone, every fall.  Tetanus booster: Tdap recommended every 10 years. Due 05/2020  HPV vaccine: Gardasil recommended up to age 34 to prevent HPV-associated diseases, including certain cancers.  Cancer screenings   Colon cancer screening: recommended for everyone at age 34  Breast cancer screening: mammogram recommended at age 34, with your family history we might consider a baseline mammogram at age 34.   Cervical cancer screening: Pap due 10/2023.    Lung cancer screening: not needed for non-smokers  Infection screenings . HIV: recommended screening at least once age 34-65, more often as needed. . Gonorrhea/Chlamydia: screening as needed . Hepatitis C: recommended for anyone born [id number] . TB: certain at-risk populations, or depending on work requirements and/or travel history        Visit summary with medication list and pertinent instructions was printed for patient to review. All questions at time of  visit were answered - patient instructed to contact office with any additional concerns or updates. ER/RTC precautions were reviewed with the patient.    Please note: voice recognition software was used to produce this document, and typos may escape review. Please contact Dr. Lyn HollingsheadAlexander for any needed clarifications.     Follow-up plan: Return in about 1 year (around 10/22/2019) for ANNUAL PHYSICAL, SOONER IF NEEDED .

## 2018-10-21 NOTE — Patient Instructions (Addendum)
General Preventive Care  Most recent routine screening lipids/other labs: reviewed today  Everyone should have blood pressure checked once per year.   Tobacco: don't!   Alcohol: responsible moderation is ok for most adults, if desired.   Exercise: as tolerated to reduce risk of cardiovascular disease and diabetes. Strength training will also prevent osteoporosis.   Mental health: if need for mental health care (medicines, counseling, other), or concerns about moods, please let me know!   Sexual health: if need for STD testing, or if concerns with libido/pain problems, please let me know! If you need to discuss your birth control options, please let me know!   Advanced Directive: Living Will and/or Healthcare Power of Attorney recommended for all adults, regardless of age or health.  Vaccines  Flu vaccine: recommended for almost everyone, every fall.   Tetanus booster: Tdap recommended every 10 years. Due 05/2020  HPV vaccine: Gardasil recommended up to age 77 to prevent HPV-associated diseases, including certain cancers.  Cancer screenings   Colon cancer screening: recommended for everyone at age 46  Breast cancer screening: mammogram recommended at age 34, with your family history we might consider a baseline mammogram at age 49.   Cervical cancer screening: Pap due 10/2023.    Lung cancer screening: not needed for non-smokers  Infection screenings . HIV: recommended screening at least once age 72-65, more often as needed. . Gonorrhea/Chlamydia: screening as needed . Hepatitis C: recommended for anyone born 84-1966 . TB: certain at-risk populations, or depending on work requirements and/or travel history

## 2019-11-25 ENCOUNTER — Other Ambulatory Visit: Payer: Self-pay | Admitting: Osteopathic Medicine

## 2019-11-25 NOTE — Telephone Encounter (Signed)
Requested  medications are  due for refill today yes  Requested medications are on the active medication list yes  Last refill 12/28  Future visit scheduled no  Notes to clinic  Last visit 10/21/2018

## 2020-04-20 DIAGNOSIS — N83201 Unspecified ovarian cyst, right side: Secondary | ICD-10-CM | POA: Diagnosis not present

## 2020-04-20 DIAGNOSIS — O43891 Other placental disorders, first trimester: Secondary | ICD-10-CM | POA: Diagnosis not present

## 2020-04-20 DIAGNOSIS — O368911 Maternal care for other specified fetal problems, first trimester, fetus 1: Secondary | ICD-10-CM | POA: Diagnosis not present

## 2020-04-20 DIAGNOSIS — O4690 Antepartum hemorrhage, unspecified, unspecified trimester: Secondary | ICD-10-CM | POA: Diagnosis not present

## 2020-04-20 DIAGNOSIS — O26891 Other specified pregnancy related conditions, first trimester: Secondary | ICD-10-CM | POA: Diagnosis not present

## 2020-04-20 DIAGNOSIS — R102 Pelvic and perineal pain: Secondary | ICD-10-CM | POA: Diagnosis not present

## 2020-04-20 DIAGNOSIS — O200 Threatened abortion: Secondary | ICD-10-CM | POA: Diagnosis not present

## 2020-04-20 DIAGNOSIS — O09521 Supervision of elderly multigravida, first trimester: Secondary | ICD-10-CM | POA: Diagnosis not present

## 2020-04-20 DIAGNOSIS — O3481 Maternal care for other abnormalities of pelvic organs, first trimester: Secondary | ICD-10-CM | POA: Diagnosis not present

## 2020-04-20 DIAGNOSIS — Z3A08 8 weeks gestation of pregnancy: Secondary | ICD-10-CM | POA: Diagnosis not present

## 2020-05-05 ENCOUNTER — Telehealth: Payer: Self-pay | Admitting: *Deleted

## 2020-05-05 NOTE — Telephone Encounter (Signed)
Kelsey Cole has a New OB appointment scheduled on 05/09/2020. She was seen at [facility] on 04/21/2020. Ms. Kathman called to see if she could be seen today instead. She is not having issues presently today. Told to keep scheduled appointment on 05/09/20, but if she starts to have any active bleeding and non normal pain, to proceed to [facility] for evaluation.

## 2020-05-09 ENCOUNTER — Ambulatory Visit (INDEPENDENT_AMBULATORY_CARE_PROVIDER_SITE_OTHER): Payer: BC Managed Care – PPO

## 2020-05-09 ENCOUNTER — Other Ambulatory Visit (HOSPITAL_COMMUNITY)
Admission: RE | Admit: 2020-05-09 | Discharge: 2020-05-09 | Disposition: A | Discharge status: Home / Self Care | Payer: BC Managed Care – PPO | Source: Ambulatory Visit

## 2020-05-09 ENCOUNTER — Other Ambulatory Visit: Payer: Self-pay

## 2020-05-09 VITALS — BP 112/68 | HR 96 | Wt 160.0 lb

## 2020-05-09 DIAGNOSIS — Z3480 Encounter for supervision of other normal pregnancy, unspecified trimester: Secondary | ICD-10-CM | POA: Insufficient documentation

## 2020-05-09 DIAGNOSIS — Z3A11 11 weeks gestation of pregnancy: Secondary | ICD-10-CM | POA: Diagnosis not present

## 2020-05-09 NOTE — Progress Notes (Signed)
Subjective:   Kelsey Cole is a 34 y.o. G2P1001 at [redacted]w[redacted]d by early ultrasound being seen today for her first obstetrical visit.  Her obstetrical history is significant for advanced maternal age and has History of ELISA positive for HSV and Supervision of other normal pregnancy, antepartum on their problem list.. Patient does intend to breast feed. Pregnancy history fully reviewed.  Patient reports no complaints. She was seen at a [facility] ER on 8/30 for bleeding and had an ultrasound that showed an IUP measuring [redacted]w[redacted]d with a subchorionic hemorrhage. See results in Care Everywhere. Patient no longer has bleeding.   HISTORY: OB History  Gravida Para Term Preterm AB Living  2 1 1  0 0 1  SAB TAB Ectopic Multiple Live Births  0 0 0 0 1    # Outcome Date GA Lbr Len/2nd Weight Sex Delivery Anes PTL Lv  2 Current           1 Term      Vag-Spont   LIV   Past Medical History:  Diagnosis Date  . 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 No pertinent surgical history. Family History  Problem Relation Age of Onset  . Hypertension Mother   . Diabetes Father   . Hypertension Paternal Uncle    Social History   Tobacco Use  . Smoking status: Never Smoker  . Smokeless tobacco: Never Used  Vaping Use  . Vaping Use: Never used  Substance Use Topics  . Alcohol use: Not Currently    Alcohol/week: 0.0 standard drinks  . Drug use: Never   No Known Allergies Current Outpatient Medications on File Prior to Visit  Medication Sig Dispense Refill  . Prenatal Vit-Fe Fumarate-FA (PRENATAL VITAMIN PO) Take by mouth.    . [DISCONTINUED] levonorgestrel-ethinyl estradiol (NORDETTE) 0.15-30 MG-MCG tablet Take 1 tablet by mouth daily. 3 Package 4   No current facility-administered medications on file prior to visit.     Exam   Vitals:   05/09/20 0902  BP: 112/68  Pulse: 96  Weight: 160 lb (72.6 kg)      Uterus:     Pelvic Exam: Perineum:  no hemorrhoids, normal perineum   Vulva: normal external genitalia, no lesions   Vagina:  normal mucosa, normal discharge   Cervix: no lesions and normal, pap smear done.    Adnexa: normal adnexa and no mass, fullness, tenderness   Bony Pelvis: average  System: General: well-developed, well-nourished female in no acute distress   Breast:  normal appearance, no masses or tenderness   Skin: normal coloration and turgor, no rashes   Neurologic: oriented, normal, negative, normal mood   Extremities: normal strength, tone, and muscle mass, ROM of all joints is normal   HEENT PERRLA, extraocular movement intact and sclera clear, anicteric   Mouth/Teeth mucous membranes moist, pharynx normal without lesions and dental hygiene good   Neck supple and no masses   Cardiovascular: regular rate and rhythm   Respiratory:  no respiratory distress, normal breath sounds   Abdomen: soft, non-tender; bowel sounds normal; no masses,  no organomegaly     Assessment:   Pregnancy: G2P1001 Patient Active Problem List   Diagnosis Date Noted  . Supervision of other normal pregnancy, antepartum 05/09/2020  . History of ELISA positive for HSV 10/16/2018     Plan:  1. [redacted] weeks gestation of pregnancy - Obstetric panel - HIV antibody (with reflex) - Hepatitis C Antibody -  Hemoglobinopathy Evaluation - Culture, OB Urine - GC/Chlamydia probe amp ([place])not at [facility] [place] bedside; Future  2. Supervision of other normal pregnancy, antepartum - No complaints. Routine care.  - Bedside u/s confirms IUP with FHR 159 bpm. Measuring today consistent with LMP. Will keep dating by LMP  -The patient was counseled on the potential benefits and lack of known risks of COVID vaccination, during pregnancy and breastfeeding, on today's visit. The patient's questions and concerns were addressed today, including safety in pregnancy. Reviewed with patient ACOG and SMFM recommendations as well as safety and efficacy in  pregnancy. Discussed that due to pregnancy, she is at greater risk for severe disease and complications. The patient is planning to get vaccinated.    Initial labs drawn. Continue prenatal vitamins. Genetic Screening discussed, NIPS: declined. Ultrasound discussed; fetal anatomic survey: requested. Problem list reviewed and updated. The nature of [facility] with multiple MDs and other Advanced Practice Providers was explained to patient; also emphasized that residents, students are part of our team. Routine obstetric precautions reviewed. Return in about 6 weeks (around 06/20/2020) for Return OB visit.   Rolm Bookbinder, CNM 05/09/20 9:41 AM

## 2020-05-09 NOTE — Patient Instructions (Signed)
Safe Medications in Pregnancy   Acne: Benzoyl Peroxide Salicylic Acid  Backache/Headache: Tylenol: 2 regular strength every 4 hours OR              2 Extra strength every 6 hours  Colds/Coughs/Allergies: Benadryl (alcohol free) 25 mg every 6 hours as needed Breath right strips Claritin Cepacol throat lozenges Chloraseptic throat spray Cold-Eeze- up to three times per day Cough drops, alcohol free Flonase (by prescription only) Guaifenesin Mucinex Robitussin DM (plain only, alcohol free) Saline nasal spray/drops Sudafed (pseudoephedrine) & Actifed ** use only after [redacted] weeks gestation and if you do not have high blood pressure Tylenol Vicks Vaporub Zinc lozenges Zyrtec   Constipation: Colace Ducolax suppositories Fleet enema Glycerin suppositories Metamucil Milk of magnesia Miralax Senokot Smooth move tea  Diarrhea: Kaopectate Imodium A-D  *NO pepto Bismol  Hemorrhoids: Anusol Anusol HC Preparation H Tucks  Indigestion: Tums Maalox Mylanta Zantac  Pepcid  Insomnia: Benadryl (alcohol free) 25mg every 6 hours as needed Tylenol PM Unisom, no Gelcaps  Leg Cramps: Tums MagGel  Nausea/Vomiting:  Bonine Dramamine Emetrol Ginger extract Sea bands Meclizine  Nausea medication to take during pregnancy:  Unisom (doxylamine succinate 25 mg tablets) Take one tablet daily at bedtime. If symptoms are not adequately controlled, the dose can be increased to a maximum recommended dose of two tablets daily (1/2 tablet in the morning, 1/2 tablet mid-afternoon and one at bedtime). Vitamin B6 100mg tablets. Take one tablet twice a day (up to 200 mg per day).  Skin Rashes: Aveeno products Benadryl cream or 25mg every 6 hours as needed Calamine Lotion 1% cortisone cream  Yeast infection: Gyne-lotrimin 7 Monistat 7   **If taking multiple medications, please check labels to avoid duplicating the same active ingredients **take medication as directed on  the label ** Do not exceed 4000 mg of tylenol in 24 hours **Do not take medications that contain aspirin or ibuprofen       Coronavirus (COVID-19) and Pregnancy:  Frequently Asked Questions   How might coronavirus affect my pregnancy? The data for COVID-19 is limited, but we know that women with other coronavirus infections (such as SARS-CoV) did NOT have miscarriage or stillbirth at higher rates than the general population.  On the other hand, we know that having other respiratory viral infections during pregnancy, such as flu, has been associated with problems like low birth weight and preterm birth. Also, having a high fever early in pregnancy may increase the risk of certain birth defects.  Could I transmit coronavirus to my baby during pregnancy or delivery? Among the few case studies of infants born to mothers with COVID-19 published in peer-reviewed literature, none of the infants tested positive for the virus. And there have been no reports of mother-to-baby transmission for other coronaviruses (MERS-CoV and SARS-CoV). Also, there was no virus detected in samples of amniotic fluid or breast milk. But there have been a few reports of newborns as young as a few days old with infection, suggesting that a mother can transmit the infection to her infant through close contact after delivery.  Is it safe for me to deliver at a hospital where there have been COVID-19 cases? It should be. We know that COVID-19 is a very scary virus. The good news is that hospitals are taking great precautions to keep patients and healthcare providers safe.  According to the CDC guidelines, when a patient is even suspected to have COVID-19, they should be placed in a negative pressure room. (Think of these   rooms as vacuums that suck and filter the air so it's safe for the other people in the hospital.) If there are no rooms available, these patients should be asked to wait at home until they can be accomodated  safely. This should make it possible for you to deliver at the hospital without putting you or your baby at risk.  Hospitals are also implementing stricter visiting policies to keep patients safe. It's worth calling your hospital to check if there are any new regulations to be aware of.  What plans should I make now in case the hospital system is overwhelmed when it's time for me to deliver? Every hospital is making different plans for dealing with this scenario. Talk with your doctor or midwife once you're at least [redacted] weeks pregnant. I work in Teacher, music.   I work in [employer], [employer]. Should I ask my doctor to excuse me from work until the baby is born? Should I ask my doctor to excuse me from work until the baby is born? What if I work in a school, the travel industry, or some other high-risk setting? Healthcare facilities should take care to limit the exposure of pregnant employees to patients with confirmed or suspected COVID-19, just as they would with other infectious cases. If you continue working, be sure to follow the CDC's risk assessment and infection control guidelines.  If you work in a school, travel industry, or other high-risk setting, talk with your employer about what it's doing to protect employees and minimize infection risks. Wash your hands often.   What if my OB gets COVID-19? If your doctor or midwife tests positive for COVID-19, they will need to be quarantined until they recover and are no longer at risk of transmitting the virus. In this case, you'll be assigned to another OB in your doctor's practice (or you may choose another practitioner yourself). Ask your new OB or your doctor's office if you should self-quarantine or be tested for the virus. (It will depend on when you last saw your provider and when that person tested positive.)  Should we hold off on trying to conceive because of COVID-19? At this time, there's no reason to hold off on trying to get pregnant, but the  data we have is really limited. For example, we don't think the virus causes birth defects or increases your risk of miscarriage. But we don't know for sure whether you could transmit COVID-19 to your baby before or during delivery. We also don't know if the virus lives in semen or can be sexually transmitted.  We have a babymoon scheduled in the next few months - should we cancel? Yes. At this time, the virus has reached more than 140 countries, and there are travel bans to [place], most of [place], and [place]. Places where large numbers of people gather are at highest risk, especially airports and cruise ships.  If you were planning travel in the [place] note that any travel setting increases your risk of exposure, and there are already many places where everyone is being asked to stay home. To see how the virus is spreading, check The [place] Times map based on CDC data.  For the most current advice to help you avoid exposure, check the CDC's COVID-19 travel page.  Will the hospital separate me from my newborn and keep the baby in quarantine? If you don't have COVID-19 and have not been exposed to the virus, the hospital will not separate you from your newborn. If you  do test positive for COVID-19 or have been exposed but have no symptoms, the CDC, American College of Obstetricians and Gynecologists, and the Society for Maternal-Fetal Medicine all recommend that you be separated from your baby to decrease the risk of transmission to the baby. This would last until you are no longer at risk of transmitting the virus.  This scenario would, of course, be beyond heartbreaking. Talk to the hospital, your baby's pediatrician, and your family about how to plan for care of your baby in the event that you have to be separated after delivery. And try to make sure you have the emotional support you would need to endure the sadness and stress of having to potentially wait weeks to meet your newborn.   My hospital is  restricting visitors and only allowing one support person. If my support person leaves after the delivery, will they be allowed to come back? Every hospital has different policies. Contact your hospital or labor and delivery unit a week or so before delivery to get the most up-to-date restrictions. In general, if your support person needs to leave, they would be allowed back unless they knew they were exposed to COVID-19 after leaving your company.  My mom was planning to fly here to help me care for my new baby after delivery. Should I tell her not to come? Yes. If your mom is over 60 or has any serious chronic medical conditions (such as heart disease, lung disease, or diabetes), she is at higher risk of serious illness from COVID-19 and should avoid air travel. And remember that any travel setting increases a person's risk of exposure. So, it may be risky to have her around the baby after she has been traveling. For the most current advice on traveling, check the CDC's COVID-19 travel page. 

## 2020-05-09 NOTE — Progress Notes (Signed)
Pt c/o "dark red discharge" and cramping

## 2020-05-10 LAB — GC/CHLAMYDIA PROBE AMP (~~LOC~~) NOT AT ARMC
Chlamydia: NEGATIVE
Comment: NEGATIVE
Comment: NORMAL
Neisseria Gonorrhea: NEGATIVE

## 2020-05-11 LAB — OBSTETRIC PANEL
Absolute Monocytes: 549 cells/uL (ref 200–950)
Antibody Screen: NOT DETECTED
Basophils Absolute: 24 cells/uL (ref 0–200)
Basophils Relative: 0.2 %
Eosinophils Absolute: 73 cells/uL (ref 15–500)
Eosinophils Relative: 0.6 %
HCT: 35.6 % (ref 35.0–45.0)
Hemoglobin: 12.2 g/dL (ref 11.7–15.5)
Hepatitis B Surface Ag: NONREACTIVE
Lymphs Abs: 3111 cells/uL (ref 850–3900)
MCH: 31.4 pg (ref 27.0–33.0)
MCHC: 34.3 g/dL (ref 32.0–36.0)
MCV: 91.8 fL (ref 80.0–100.0)
MPV: 9.4 fL (ref 7.5–12.5)
Monocytes Relative: 4.5 %
Neutro Abs: 8442 cells/uL — ABNORMAL HIGH (ref 1500–7800)
Neutrophils Relative %: 69.2 %
Platelets: 287 10*3/uL (ref 140–400)
RBC: 3.88 10*6/uL (ref 3.80–5.10)
RDW: 12.9 % (ref 11.0–15.0)
RPR Ser Ql: NONREACTIVE
Rubella: 4.78 Index
Total Lymphocyte: 25.5 %
WBC: 12.2 10*3/uL — ABNORMAL HIGH (ref 3.8–10.8)

## 2020-05-11 LAB — HIV ANTIBODY (ROUTINE TESTING W REFLEX): HIV 1&2 Ab, 4th Generation: NONREACTIVE

## 2020-05-11 LAB — HEMOGLOBINOPATHY EVALUATION
Fetal Hemoglobin Testing: <1 % (ref 0.0–1.9)
HCT: 37.7 % (ref 35.0–45.0)
Hemoglobin A2 - HGBRFX: 3 % (ref 2.2–3.2)
Hemoglobin: 12.2 g/dL (ref 11.7–15.5)
Hgb A: 97 % (ref >96.0)
MCH: 30.3 pg (ref 27.0–33.0)
MCV: 93.5 fL (ref 80.0–100.0)
RBC: 4.03 10*6/uL (ref 3.80–5.10)
RDW: 12.8 % (ref 11.0–15.0)

## 2020-05-11 LAB — CULTURE, OB URINE

## 2020-05-11 LAB — HEPATITIS C ANTIBODY
Hepatitis C Ab: NONREACTIVE
SIGNAL TO CUT-OFF: 0.01 (ref <1.00)

## 2020-05-11 LAB — URINE CULTURE, OB REFLEX

## 2020-06-20 ENCOUNTER — Telehealth (INDEPENDENT_AMBULATORY_CARE_PROVIDER_SITE_OTHER): Payer: BC Managed Care – PPO | Admitting: Certified Nurse Midwife

## 2020-06-20 ENCOUNTER — Encounter: Payer: Self-pay | Admitting: Certified Nurse Midwife

## 2020-06-20 VITALS — Wt 161.0 lb

## 2020-06-20 DIAGNOSIS — Z3A17 17 weeks gestation of pregnancy: Secondary | ICD-10-CM

## 2020-06-20 DIAGNOSIS — Z3480 Encounter for supervision of other normal pregnancy, unspecified trimester: Secondary | ICD-10-CM

## 2020-06-20 DIAGNOSIS — O219 Vomiting of pregnancy, unspecified: Secondary | ICD-10-CM

## 2020-06-20 MED ORDER — ONDANSETRON 4 MG PO TBDP: 4.0000 mg | ORAL_TABLET | Freq: Three times a day (TID) | ORAL | 1 refills | Status: DC | PRN

## 2020-06-20 NOTE — Patient Instructions (Signed)

## 2020-06-20 NOTE — Progress Notes (Signed)
Pt has not taken BP 

## 2020-06-20 NOTE — Progress Notes (Signed)
OBSTETRICS PRENATAL VIRTUAL VISIT ENCOUNTER NOTE  Provider location: [facility] [place]   I connected with Kelsey Cole on 06/20/20 at  9:30 AM EDT by MyChart Video Encounter at home and verified that I am speaking with the correct person using two identifiers.   I discussed the limitations, risks, security and privacy concerns of performing an evaluation and management service virtually and the availability of in person appointments. I also discussed with the patient that there may be a patient responsible charge related to this service. The patient expressed understanding and agreed to proceed. Subjective:  Kelsey Cole is a 34 y.o. G2P1001 at [redacted]w[redacted]d being seen today for ongoing prenatal care.  She is currently monitored for the following issues for this low-risk pregnancy and has History of ELISA positive for HSV and Supervision of other normal pregnancy, antepartum on their problem list.  Patient reports fatigue and nausea.  Contractions: Not present. Vag. Bleeding: None.  Movement: Absent. Denies any leaking of fluid.   The following portions of the patient's history were reviewed and updated as appropriate: allergies, current medications, past family history, past medical history, past social history, past surgical history and problem list.   Objective:   Vitals:   06/20/20 0925  Weight: 161 lb (73 kg)    Fetal Status:     Movement: Absent     General:  Alert, oriented and cooperative. Patient is in no acute distress.  Respiratory: Normal respiratory effort, no problems with respiration noted  Mental Status: Normal mood and affect. Normal behavior. Normal judgment and thought content.  Rest of physical exam deferred due to type of encounter  Imaging: No results found.  Assessment and Plan:  Pregnancy: G2P1001 at [redacted]w[redacted]d 1. [redacted] weeks gestation of pregnancy - Reviewed initial prenatal lab results with patient   2. Supervision of other normal  pregnancy, antepartum - Patient doing well, reports occasional nausea and some fatigue  - Educated and discussed high protein diet to help with fatigue  - Routine prenatal care - Anticipatory guidance on upcoming appointments with next one being [place] appointment  - Patient is not taking BP at home, discussed importance of taking BP and will make next appointments in office due to patient not taking them  - Discussed fetal movement during pregnancy and when to expect movement - [place] MFM OB COMP + 14 WK; Future  3. Nausea and vomiting during pregnancy - patient reports nausea daily and occasional emesis  - ondansetron (ZOFRAN ODT) 4 MG disintegrating tablet; Take 1 tablet (4 mg total) by mouth every 8 (eight) hours as needed for nausea or vomiting.  Dispense: 30 tablet; Refill: 1   Preterm labor symptoms and general obstetric precautions including but not limited to vaginal bleeding, contractions, leaking of fluid and fetal movement were reviewed in detail with the patient. I discussed the assessment and treatment plan with the patient. The patient was provided an opportunity to ask questions and all were answered. The patient agreed with the plan and demonstrated an understanding of the instructions. The patient was advised to call back or seek an in-person office evaluation/go to [facility] at [facility] for any urgent or concerning symptoms. Please refer to After Visit Summary for other counseling recommendations.   I provided 15 minutes of face-to-face time during this encounter.  Return in about 4 weeks (around 07/18/2020) for ROB- in person .  Future Appointments  Date Time Provider Department Center  07/18/2020  9:30 AM Sharyon Cable,  CNM [facility]    Sharyon Cable, CNM [facility] [employer], [facility]

## 2020-06-26 DIAGNOSIS — Z23 Encounter for immunization: Secondary | ICD-10-CM | POA: Diagnosis not present

## 2020-07-05 ENCOUNTER — Ambulatory Visit: Payer: BC Managed Care – PPO | Attending: Certified Nurse Midwife

## 2020-07-05 ENCOUNTER — Other Ambulatory Visit: Payer: Self-pay | Admitting: *Deleted

## 2020-07-05 ENCOUNTER — Other Ambulatory Visit: Payer: Self-pay | Admitting: Certified Nurse Midwife

## 2020-07-05 ENCOUNTER — Other Ambulatory Visit: Payer: Self-pay

## 2020-07-05 DIAGNOSIS — Z3480 Encounter for supervision of other normal pregnancy, unspecified trimester: Secondary | ICD-10-CM | POA: Insufficient documentation

## 2020-07-05 DIAGNOSIS — Z362 Encounter for other antenatal screening follow-up: Secondary | ICD-10-CM

## 2020-07-18 ENCOUNTER — Ambulatory Visit (INDEPENDENT_AMBULATORY_CARE_PROVIDER_SITE_OTHER): Payer: BC Managed Care – PPO | Admitting: Certified Nurse Midwife

## 2020-07-18 ENCOUNTER — Other Ambulatory Visit: Payer: Self-pay

## 2020-07-18 ENCOUNTER — Encounter: Payer: Self-pay | Admitting: Certified Nurse Midwife

## 2020-07-18 VITALS — BP 104/60 | HR 93 | Wt 165.0 lb

## 2020-07-18 DIAGNOSIS — Z3A21 21 weeks gestation of pregnancy: Secondary | ICD-10-CM

## 2020-07-18 DIAGNOSIS — Z3480 Encounter for supervision of other normal pregnancy, unspecified trimester: Secondary | ICD-10-CM

## 2020-07-18 DIAGNOSIS — Z7189 Other specified counseling: Secondary | ICD-10-CM | POA: Diagnosis not present

## 2020-07-18 DIAGNOSIS — O26899 Other specified pregnancy related conditions, unspecified trimester: Secondary | ICD-10-CM

## 2020-07-18 DIAGNOSIS — O26892 Other specified pregnancy related conditions, second trimester: Secondary | ICD-10-CM

## 2020-07-18 DIAGNOSIS — R102 Pelvic and perineal pain: Secondary | ICD-10-CM

## 2020-07-18 MED ORDER — COMFORT FIT MATERNITY SUPP LG MISC: 1.0000 [IU] | Freq: Every day | 0 refills | Status: DC

## 2020-07-18 NOTE — Patient Instructions (Addendum)
PREGNANCY SUPPORT BELT: You are not alone, Seventy-five percent of women have some sort of abdominal or back pain at some point in their pregnancy. Your baby is growing at a fast pace, which means that your whole body is rapidly trying to adjust to the changes. As your uterus grows, your back may start feeling a bit under stress and this can result in back or abdominal pain that can go from mild, and therefore bearable, to severe pains that will not allow you to sit or lay down comfortably, When it comes to dealing with pregnancy-related pains and cramps, some pregnant women usually prefer natural remedies, which the market is filled with nowadays. For example, wearing a pregnancy support belt can help ease and lessen your discomfort and pain. WHAT ARE THE BENEFITS OF WEARING A PREGNANCY SUPPORT BELT? A pregnancy support belt provides support to the lower portion of the belly taking some of the weight of the growing uterus and distributing to the other parts of your body. It is designed make you comfortable and gives you extra support. Over the years, the pregnancy apparel market has been studying the needs and wants of pregnant women and they have come up with the most comfortable pregnancy support belts that woman could ever ask for. In fact, you will no longer have to wear a stretched-out or bulky pregnancy belt that is visible underneath your clothes and makes you feel even more uncomfortable. Nowadays, a pregnancy support belt is made of comfortable and stretchy materials that will not irritate your skin but will actually make you feel at ease and you will not even notice you are wearing it. They are easy to put on and adjust during the day and can be worn at night for additional support.  BENEFITS: . Relives Back pain . Relieves Abdominal Muscle and Leg Pain . Stabilizes the Pelvic Ring . Offers a Cushioned Abdominal Lift Pad . Relieves pressure on the Sciatic Nerve Within Minutes WHERE TO GET  YOUR PREGNANCY BELT: [employer] [phone] @[phone] [place], [place]   Second Trimester of Pregnancy  The second trimester is from week 14 through week 27 (month 4 through 6). This is often the time in pregnancy that you feel your best. Often times, morning sickness has lessened or quit. You may have more energy, and you may get hungry more often. Your unborn baby is growing rapidly. At the end of the sixth month, he or she is about 9 inches long and weighs about 1 pounds. You will likely feel the baby move between 18 and 20 weeks of pregnancy. Follow these instructions at home: Medicines  Take over-the-counter and prescription medicines only as told by your doctor. Some medicines are safe and some medicines are not safe during pregnancy.  Take a prenatal vitamin that contains at least 600 micrograms (mcg) of folic acid.  If you have trouble pooping (constipation), take medicine that will make your stool soft (stool softener) if your doctor approves. Eating and drinking   Eat regular, healthy meals.  Avoid raw meat and uncooked cheese.  If you get low calcium from the food you eat, talk to your doctor about taking a daily calcium supplement.  Avoid foods that are high in fat and sugars, such as fried and sweet foods.  If you feel sick to your stomach (nauseous) or throw up (vomit): ? Eat 4 or 5 small meals a day instead of 3 large meals. ? Try eating a  few soda crackers. ? Drink liquids between meals instead of during meals.  To prevent constipation: ? Eat foods that are high in fiber, like fresh fruits and vegetables, whole grains, and beans. ? Drink enough fluids to keep your pee (urine) clear or pale yellow. Activity  Exercise only as told by your doctor. Stop exercising if you start to have cramps.  Do not exercise if it is too hot, too humid, or if you are in a place of great height (high altitude).  Avoid heavy lifting.  Wear  low-heeled shoes. Sit and stand up straight.  You can continue to have sex unless your doctor tells you not to. Relieving pain and discomfort  Wear a good support bra if your breasts are tender.  Take warm water baths (sitz baths) to soothe pain or discomfort caused by hemorrhoids. Use hemorrhoid cream if your doctor approves.  Rest with your legs raised if you have leg cramps or low back pain.  If you develop puffy, bulging veins (varicose veins) in your legs: ? Wear support hose or compression stockings as told by your doctor. ? Raise (elevate) your feet for 15 minutes, 3-4 times a day. ? Limit salt in your food. Prenatal care  Write down your questions. Take them to your prenatal visits.  Keep all your prenatal visits as told by your doctor. This is important. Safety  Wear your seat belt when driving.  Make a list of emergency phone numbers, including numbers for family, friends, the hospital, and police and fire departments. General instructions  Ask your doctor about the right foods to eat or for help finding a counselor, if you need these services.  Ask your doctor about local prenatal classes. Begin classes before month 6 of your pregnancy.  Do not use hot tubs, steam rooms, or saunas.  Do not douche or use tampons or scented sanitary pads.  Do not cross your legs for long periods of time.  Visit your dentist if you have not done so. Use a soft toothbrush to brush your teeth. Floss gently.  Avoid all smoking, herbs, and alcohol. Avoid drugs that are not approved by your doctor.  Do not use any products that contain nicotine or tobacco, such as cigarettes and e-cigarettes. If you need help quitting, ask your doctor.  Avoid cat litter boxes and soil used by cats. These carry germs that can cause birth defects in the baby and can cause a loss of your baby (miscarriage) or stillbirth. Contact a doctor if:  You have mild cramps or pressure in your lower belly.  You  have pain when you pee (urinate).  You have bad smelling fluid coming from your vagina.  You continue to feel sick to your stomach (nauseous), throw up (vomit), or have watery poop (diarrhea).  You have a nagging pain in your belly area.  You feel dizzy. Get help right away if:  You have a fever.  You are leaking fluid from your vagina.  You have spotting or bleeding from your vagina.  You have severe belly cramping or pain.  You lose or gain weight rapidly.  You have trouble catching your breath and have chest pain.  You notice sudden or extreme puffiness (swelling) of your face, hands, ankles, feet, or legs.  You have not felt the baby move in over an hour.  You have severe headaches that do not go away when you take medicine.  You have trouble seeing. Summary  The second trimester is from week  14 through week 27 (months 4 through 6). This is often the time in pregnancy that you feel your best.  To take care of yourself and your unborn baby, you will need to eat healthy meals, take medicines only if your doctor tells you to do so, and do activities that are safe for you and your baby.  Call your doctor if you get sick or if you notice anything unusual about your pregnancy. Also, call your doctor if you need help with the right food to eat, or if you want to know what activities are safe for you. This information is not intended to replace advice given to you by your health care provider. Make sure you discuss any questions you have with your health care provider. Document Revised: 12/11/2018 Document Reviewed: 09/24/2016 Elsevier Patient Education  2020 [employer].

## 2020-07-18 NOTE — Progress Notes (Signed)
PRENATAL VISIT NOTE  Subjective:  Kelsey Cole is a 34 y.o. G2P1001 at [redacted]w[redacted]d being seen today for ongoing prenatal care.  She is currently monitored for the following issues for this low-risk pregnancy and has History of ELISA positive for HSV and Supervision of other normal pregnancy, antepartum on their problem list.  Patient reports sharp pain with walking/standing or position changes at night.  Contractions: Not present. Vag. Bleeding: None.  Movement: Present. Denies leaking of fluid.   The following portions of the patient's history were reviewed and updated as appropriate: allergies, current medications, past family history, past medical history, past social history, past surgical history and problem list.   Objective:   Vitals:   07/18/20 0921  BP: 104/60  Pulse: 93  Weight: 165 lb (74.8 kg)    Fetal Status: Fetal Heart Rate (bpm): 145 Fundal Height: 23 cm Movement: Present     General:  Alert, oriented and cooperative. Patient is in no acute distress.  Skin: Skin is warm and dry. No rash noted.   Cardiovascular: Normal heart rate noted  Respiratory: Normal respiratory effort, no problems with respiration noted  Abdomen: Soft, gravid, appropriate for gestational age.  Pain/Pressure: Present     Pelvic: Cervical exam deferred        Extremities: Normal range of motion.  Edema: None  Mental Status: Normal mood and affect. Normal behavior. Normal judgment and thought content.   Assessment and Plan:  Pregnancy: G2P1001 at [redacted]w[redacted]d 1. Supervision of other normal pregnancy, antepartum - Patient doing well - routine prenatal care - anticipatory guidance on upcoming appointments  - patient reports nausea has resolved and reports she is able to feel fetal movements   2. [redacted] weeks gestation of pregnancy - educated and discussed TDAP vaccine, will give around 28 weeks   3. Pain of round ligament during pregnancy - educated and discussed RLP during pregnancy  - discussed  slow position changes, tylenol and use of maternity support belt  - Elastic Bandages & Supports (COMFORT FIT MATERNITY SUPP LG) MISC; 1 Units by Does not apply route daily.  Dispense: 1 each; Refill: 0  4. COVID-19 Vaccine Counseling  - The patient was counseled on the potential benefits and lack of known risks of COVID vaccination, during pregnancy and breastfeeding, during today's visit. The patient's questions and concerns were addressed today, including safety of the vaccination and potential side effects as they have been published by ACOG and SMFM. The patient has been informed that there have not been any documented vaccine related injuries, deaths or birth defects to infant or mom after receiving the COVID-19 vaccine to date. The patient has been made aware that although she is not at increased risk of contracting COVID-19 during pregnancy, she is at increased risk of developing severe disease and complications if she contracts COVID-19 while pregnant. All patient questions were addressed during our visit today. The patient is still unsure of her decision for vaccination.    Preterm labor symptoms and general obstetric precautions including but not limited to vaginal bleeding, contractions, leaking of fluid and fetal movement were reviewed in detail with the patient. Please refer to After Visit Summary for other counseling recommendations.   Return in about 4 weeks (around 08/15/2020) for LROB, in person.  Future Appointments  Date Time Provider Department Center  08/02/2020 11:00 AM [facility]  08/02/2020 11:15 AM [facility] US2 [facility]  08/15/2020  9:30 AM Sharyon Cable, CNM [facility]    Suzette Battiest  Eliezer Lofts, CNM

## 2020-08-02 ENCOUNTER — Ambulatory Visit: Payer: BC Managed Care – PPO | Attending: Obstetrics and Gynecology

## 2020-08-02 ENCOUNTER — Encounter: Payer: Self-pay | Admitting: *Deleted

## 2020-08-02 ENCOUNTER — Other Ambulatory Visit: Payer: Self-pay | Admitting: *Deleted

## 2020-08-02 ENCOUNTER — Other Ambulatory Visit: Payer: Self-pay

## 2020-08-02 ENCOUNTER — Ambulatory Visit: Payer: BC Managed Care – PPO | Admitting: *Deleted

## 2020-08-02 VITALS — BP 105/55 | HR 85

## 2020-08-02 DIAGNOSIS — O09522 Supervision of elderly multigravida, second trimester: Secondary | ICD-10-CM | POA: Diagnosis not present

## 2020-08-02 DIAGNOSIS — Z3A23 23 weeks gestation of pregnancy: Secondary | ICD-10-CM | POA: Diagnosis not present

## 2020-08-02 DIAGNOSIS — Z362 Encounter for other antenatal screening follow-up: Secondary | ICD-10-CM | POA: Diagnosis not present

## 2020-08-15 ENCOUNTER — Encounter: Payer: Self-pay | Admitting: Certified Nurse Midwife

## 2020-08-15 ENCOUNTER — Other Ambulatory Visit: Payer: Self-pay

## 2020-08-15 ENCOUNTER — Ambulatory Visit (INDEPENDENT_AMBULATORY_CARE_PROVIDER_SITE_OTHER): Payer: BC Managed Care – PPO | Admitting: Certified Nurse Midwife

## 2020-08-15 VITALS — BP 113/70 | HR 105 | Wt 166.0 lb

## 2020-08-15 DIAGNOSIS — R002 Palpitations: Secondary | ICD-10-CM

## 2020-08-15 DIAGNOSIS — Z3A25 25 weeks gestation of pregnancy: Secondary | ICD-10-CM

## 2020-08-15 DIAGNOSIS — Z3480 Encounter for supervision of other normal pregnancy, unspecified trimester: Secondary | ICD-10-CM

## 2020-08-15 NOTE — Progress Notes (Signed)
   PRENATAL VISIT NOTE  Subjective:  Kelsey Cole is a 34 y.o. G2P1001 at [redacted]w[redacted]d being seen today for ongoing prenatal care.  She is currently monitored for the following issues for this low-risk pregnancy and has History of ELISA positive for HSV and Supervision of other normal pregnancy, antepartum on their problem list.  Patient reports heart palpitations.  Contractions: Not present. Vag. Bleeding: None.  Movement: Present. Denies leaking of fluid.   The following portions of the patient's history were reviewed and updated as appropriate: allergies, current medications, past family history, past medical history, past social history, past surgical history and problem list.   Objective:   Vitals:   08/15/20 0921  BP: 113/70  Pulse: (!) 105  Weight: 166 lb (75.3 kg)    Fetal Status: Fetal Heart Rate (bpm): 151   Movement: Present     General:  Alert, oriented and cooperative. Patient is in no acute distress.  Skin: Skin is warm and dry. No rash noted.   Cardiovascular: Normal heart rate noted  Respiratory: Normal respiratory effort, no problems with respiration noted  Abdomen: Soft, gravid, appropriate for gestational age.  Pain/Pressure: Absent     Pelvic: Cervical exam deferred        Extremities: Normal range of motion.  Edema: None  Mental Status: Normal mood and affect. Normal behavior. Normal judgment and thought content.   Assessment and Plan:  Pregnancy: G2P1001 at [redacted]w[redacted]d 1. [redacted] weeks gestation of pregnancy - Educated and discussed GTT at next prenatal visit, discussed to present to appointment fasting after MN, patient verbalizes understanding   2. Supervision of other normal pregnancy, antepartum - Routine prenatal care - Anticipatory guidance on upcoming appointments  - Patient reports RLP is better with maternity support belt   3. Heart palpitations - patient reports heart palpitations over the past 2 weeks, patient reports they sometimes occur with movement but  also at rest  - Patient denies hx of heart palpitations, patient denies SOB or lightheadedness with occurrence - Educated and discussed with patient referral to cardiology to r/o abnormalities, patient declines referral  - Discussed with patient risks of continued palpitations, patient verbalizes understanding, encouraged to continue to monitor, if occurring more frequently or associated with other symptoms to call office immediately or be seen in [facility]U.  - Encouraged to increase amount of water she is consuming, patient verbalizes understanding   Preterm labor symptoms and general obstetric precautions including but not limited to vaginal bleeding, contractions, leaking of fluid and fetal movement were reviewed in detail with the patient. Please refer to After Visit Summary for other counseling recommendations.   Return in about 4 weeks (around 09/12/2020) for LROB, GTT, in person.  Future Appointments  Date Time Provider Department Center  09/12/2020  8:30 AM Sharyon Cable CNM [facility]  09/27/2020 11:00 AM [facility] NURSE [facility]  09/27/2020 11:15 AM [facility]2 [facility]    Sharyon Cable, CNM

## 2020-08-15 NOTE — Patient Instructions (Signed)
Glucose Tolerance Test During Pregnancy Why am I having this test? The glucose tolerance test (GTT) is done to check how your body processes sugar (glucose). This is one of several tests used to diagnose diabetes that develops during pregnancy (gestational diabetes mellitus). Gestational diabetes is a temporary form of diabetes that some women develop during pregnancy. It usually occurs during the second trimester of pregnancy and goes away after delivery. Testing (screening) for gestational diabetes usually occurs between 24 and 28 weeks of pregnancy. You may have the GTT test after having a 1-hour glucose screening test if the results from that test indicate that you may have gestational diabetes. You may also have this test if:  You have a history of gestational diabetes.  You have a history of giving birth to very large babies or have experienced repeated fetal loss (stillbirth).  You have signs and symptoms of diabetes, such as: ? Changes in your vision. ? Tingling or numbness in your hands or feet. ? Changes in hunger, thirst, and urination that are not otherwise explained by your pregnancy. What is being tested? This test measures the amount of glucose in your blood at different times during a period of 3 hours. This indicates how well your body is able to process glucose. What kind of sample is taken?  Blood samples are required for this test. They are usually collected by inserting a needle into a blood vessel. How do I prepare for this test?  For 3 days before your test, eat normally. Have plenty of carbohydrate-rich foods.  Follow instructions from your health care provider about: ? Eating or drinking restrictions on the day of the test. You may be asked to not eat or drink anything other than water (fast) starting 8-10 hours before the test. ? Changing or stopping your regular medicines. Some medicines may interfere with this test. Tell a health care provider about:  All  medicines you are taking, including vitamins, herbs, eye drops, creams, and over-the-counter medicines.  Any blood disorders you have.  Any surgeries you have had.  Any medical conditions you have. What happens during the test? First, your blood glucose will be measured. This is referred to as your fasting blood glucose, since you fasted before the test. Then, you will drink a glucose solution that contains a certain amount of glucose. Your blood glucose will be measured again 1, 2, and 3 hours after drinking the solution. This test takes about 3 hours to complete. You will need to stay at the testing location during this time. During the testing period:  Do not eat or drink anything other than the glucose solution.  Do not exercise.  Do not use any products that contain nicotine or tobacco, such as cigarettes and e-cigarettes. If you need help stopping, ask your health care provider. The testing procedure may vary among health care providers and hospitals. How are the results reported? Your results will be reported as milligrams of glucose per deciliter of blood (mg/dL) or millimoles per liter (mmol/L). Your health care provider will compare your results to normal ranges that were established after testing a large group of people (reference ranges). Reference ranges may vary among labs and hospitals. For this test, common reference ranges are:  Fasting: less than 95-105 mg/dL (5.3-5.8 mmol/L).  1 hour after drinking glucose: less than 180-190 mg/dL (10.0-10.5 mmol/L).  2 hours after drinking glucose: less than 155-165 mg/dL (8.6-9.2 mmol/L).  3 hours after drinking glucose: 140-145 mg/dL (7.8-8.1 mmol/L). What do the   results mean? Results within reference ranges are considered normal, meaning that your glucose levels are well-controlled. If two or more of your blood glucose levels are high, you may be diagnosed with gestational diabetes. If only one level is high, your health care  provider may suggest repeat testing or other tests to confirm a diagnosis. Talk with your health care provider about what your results mean. Questions to ask your health care provider Ask your health care provider, or the department that is doing the test:  When will my results be ready?  How will I get my results?  What are my treatment options?  What other tests do I need?  What are my next steps? Summary  The glucose tolerance test (GTT) is one of several tests used to diagnose diabetes that develops during pregnancy (gestational diabetes mellitus). Gestational diabetes is a temporary form of diabetes that some women develop during pregnancy.  You may have the GTT test after having a 1-hour glucose screening test if the results from that test indicate that you may have gestational diabetes. You may also have this test if you have any symptoms or risk factors for gestational diabetes.  Talk with your health care provider about what your results mean. This information is not intended to replace advice given to you by your health care provider. Make sure you discuss any questions you have with your health care provider. Document Revised: 12/10/2018 Document Reviewed: 03/31/2017 Elsevier Patient Education  2020 Elsevier Inc.  

## 2020-09-02 NOTE — L&D Delivery Note (Addendum)
OB/GYN Faculty Practice Delivery Note  Kelsey Cole is a 35 y.o. G2P1001 s/p vaginal delivery at [redacted]w[redacted]d. She was admitted for IOL secondary to GDMA1.   ROM: 3h 70m with clear fluid GBS Status: negative Maximum Maternal Temperature: 98.8 F  Labor Progress: Pitocin was started on admission. AROM for clear fluid at 1600. She then progressed to complete cervical dilation and had an uncomplicated delivery as noted below.  Delivery Date/Time: 11/19/20 at 1915 Delivery: Called to room and patient was complete and pushing. Head delivered ROA. No nuchal cord present. Compound hand present. Shoulder and body delivered in usual fashion. Infant with spontaneous cry, placed on mother's abdomen, dried and stimulated. Cord clamped x 2 after 1-minute delay, and cut by FOB under my direct supervision. Cord blood drawn. Placenta delivered spontaneously with gentle cord traction. Fundus firm with massage and Pitocin. Labia, perineum, vagina, and cervix were inspected, notable for hemostatic first degree perineal laceration as noted below.  Placenta: Intact, 3 vessel cord, sent to L&D Complications: Compound hand presentation Lacerations: hemostatic 1st degree perineal without repair EBL: 50 mL Analgesia: Epidural   Infant: viable female  APGARs 9 and 9  weight per medical record  Henrietta Hoover MD, PGY1   I was present and gloved for delivery of infant and placenta as noted.   Sheila Oats, MD OB Fellow, Faculty Practice 11/19/2020 7:49 PM

## 2020-09-12 ENCOUNTER — Ambulatory Visit (INDEPENDENT_AMBULATORY_CARE_PROVIDER_SITE_OTHER): Payer: BC Managed Care – PPO | Admitting: Certified Nurse Midwife

## 2020-09-12 ENCOUNTER — Other Ambulatory Visit: Payer: Self-pay

## 2020-09-12 ENCOUNTER — Encounter: Payer: Self-pay | Admitting: Certified Nurse Midwife

## 2020-09-12 VITALS — BP 111/63 | HR 97 | Wt 170.0 lb

## 2020-09-12 DIAGNOSIS — Z3480 Encounter for supervision of other normal pregnancy, unspecified trimester: Secondary | ICD-10-CM

## 2020-09-12 DIAGNOSIS — R350 Frequency of micturition: Secondary | ICD-10-CM

## 2020-09-12 DIAGNOSIS — Z3A29 29 weeks gestation of pregnancy: Secondary | ICD-10-CM

## 2020-09-12 NOTE — Patient Instructions (Signed)
Oral Glucose Tolerance Test During Pregnancy Why am I having this test? The oral glucose tolerance test (OGTT) is done to check how your body processes blood sugar (glucose). This is one of several tests used to diagnose diabetes that develops during pregnancy (gestational diabetes mellitus). Gestational diabetes is a short-term form of diabetes that some women develop while they are pregnant. It usually occurs during the second trimester of pregnancy and goes away after delivery. Testing, or screening, for gestational diabetes usually occurs at weeks 24-28 of pregnancy. You may have the OGTT test after having a 1-hour glucose screening test if the results from that test indicate that you may have gestational diabetes. This test may also be needed if:  You have a history of gestational diabetes.  There is a history of giving birth to very large babies or of losing pregnancies (having stillbirths).  You have signs and symptoms of diabetes, such as: ? Changes in your eyesight. ? Tingling or numbness in your hands or feet. ? Changes in hunger, thirst, and urination, and these are not explained by your pregnancy. What is being tested? This test measures the amount of glucose in your blood at different times during a period of 3 hours. This shows how well your body can process glucose. What kind of sample is taken? Blood samples are required for this test. They are usually collected by inserting a needle into a blood vessel.   How do I prepare for this test?  For 3 days before your test, eat normally. Have plenty of carbohydrate-rich foods.  Follow instructions from your health care provider about: ? Eating or drinking restrictions on the day of the test. You may be asked not to eat or drink anything other than water (to fast) starting 8-10 hours before the test. ? Changing or stopping your regular medicines. Some medicines may interfere with this test. Tell a health care provider about:  All  medicines you are taking, including vitamins, herbs, eye drops, creams, and over-the-counter medicines.  Any blood disorders you have.  Any surgeries you have had.  Any medical conditions you have. What happens during the test? First, your blood glucose will be measured. This is referred to as your fasting blood glucose because you fasted before the test. Then, you will drink a glucose solution that contains a certain amount of glucose. Your blood glucose will be measured again 1, 2, and 3 hours after you drink the solution. This test takes about 3 hours to complete. You will need to stay at the testing location during this time. During the testing period:  Do not eat or drink anything other than the glucose solution.  Do not exercise.  Do not use any products that contain nicotine or tobacco, such as cigarettes, e-cigarettes, and chewing tobacco. These can affect your test results. If you need help quitting, ask your health care provider. The testing procedure may vary among health care providers and hospitals. How are the results reported? Your results will be reported as milligrams of glucose per deciliter of blood (mg/dL) or millimoles per liter (mmol/L). There is more than one source for screening and diagnosis reference values used to diagnose gestational diabetes. Your health care provider will compare your results to normal values that were established after testing a large group of people (reference values). Reference values may vary among labs and hospitals. For this test (Carpenter-Coustan), reference values are:  Fasting: 95 mg/dL (5.3 mmol/L).  1 hour: 180 mg/dL (10.0 mmol/L).  2 hour:   155 mg/dL (8.6 mmol/L).  3 hour: 140 mg/dL (7.8 mmol/L). What do the results mean? Results below the reference values are considered normal. If two or more of your blood glucose levels are at or above the reference values, you may be diagnosed with gestational diabetes. If only one level is  high, your health care provider may suggest repeat testing or other tests to confirm a diagnosis. Talk with your health care provider about what your results mean. Questions to ask your health care provider Ask your health care provider, or the department that is doing the test:  When will my results be ready?  How will I get my results?  What are my treatment options?  What other tests do I need?  What are my next steps? Summary  The oral glucose tolerance test (OGTT) is one of several tests used to diagnose diabetes that develops during pregnancy (gestational diabetes mellitus). Gestational diabetes is a short-term form of diabetes that some women develop while they are pregnant.  You may have the OGTT test after having a 1-hour glucose screening test if the results from that test show that you may have gestational diabetes. You may also have this test if you have any symptoms or risk factors for this type of diabetes.  Talk with your health care provider about what your results mean. This information is not intended to replace advice given to you by your health care provider. Make sure you discuss any questions you have with your health care provider. Document Revised: 01/27/2020 Document Reviewed: 01/27/2020 Elsevier Patient Education  2021 Elsevier Inc.  

## 2020-09-12 NOTE — Progress Notes (Signed)
   PRENATAL VISIT NOTE  Subjective:  Kelsey Cole is a 35 y.o. G2P1001 at [redacted]w[redacted]d being seen today for ongoing prenatal care.  She is currently monitored for the following issues for this low-risk pregnancy and has History of ELISA positive for HSV and Supervision of other normal pregnancy, antepartum on their problem list.  Patient reports urinary frequency.  Contractions: Irritability. Vag. Bleeding: None.  Movement: Present. Denies leaking of fluid.   The following portions of the patient's history were reviewed and updated as appropriate: allergies, current medications, past family history, past medical history, past social history, past surgical history and problem list.   Objective:   Vitals:   09/12/20 0821  BP: 111/63  Pulse: 97  Weight: 170 lb (77.1 kg)    Fetal Status: Fetal Heart Rate (bpm): 149 Fundal Height: 31 cm Movement: Present     General:  Alert, oriented and cooperative. Patient is in no acute distress.  Skin: Skin is warm and dry. No rash noted.   Cardiovascular: Normal heart rate noted  Respiratory: Normal respiratory effort, no problems with respiration noted  Abdomen: Soft, gravid, appropriate for gestational age.  Pain/Pressure: Present     Pelvic: Cervical exam deferred        Extremities: Normal range of motion.  Edema: None  Mental Status: Normal mood and affect. Normal behavior. Normal judgment and thought content.   Assessment and Plan:  Pregnancy: G2P1001 at [redacted]w[redacted]d 1. Supervision of other normal pregnancy, antepartum - Routine prenatal care - Anticipatory guidance on upcoming appointments  - Educated and discussed GTT obtained today, educated and discussed with patient results of GTT and what abnormal results mean - discussed with patient will call or send mychart message with abnormal results, patient verbalizes understanding   2. [redacted] weeks gestation of pregnancy - HIV antibody (with reflex) - CBC - RPR - 2Hr GTT w/ 1 Hr Carpenter 75 g  3.  Urinary frequency - Patient reports urinary frequency and pressure that has been occurring over the past 1-2 weeks  - Patient reports that once she urinates pelvic pressure is resolved then urge to urinate and pressure returns shortly after, patient reports small amount of urine when she does urinate  - Culture, OB Urine  Preterm labor symptoms and general obstetric precautions including but not limited to vaginal bleeding, contractions, leaking of fluid and fetal movement were reviewed in detail with the patient. Please refer to After Visit Summary for other counseling recommendations.   Return in about 2 weeks (around 09/26/2020) for LROB, in person/TDAP.  Future Appointments  Date Time Provider Department Center  09/27/2020 11:00 AM [facility] NURSE [facility]  09/27/2020 11:15 AM [facility] US2 [facility]  09/29/2020  9:10 AM Donette Larry, CNM [facility]    Sharyon Cable, CNM

## 2020-09-13 ENCOUNTER — Telehealth: Payer: Self-pay | Admitting: *Deleted

## 2020-09-13 DIAGNOSIS — O24419 Gestational diabetes mellitus in pregnancy, unspecified control: Secondary | ICD-10-CM

## 2020-09-13 LAB — RPR: RPR Ser Ql: NONREACTIVE

## 2020-09-13 LAB — CBC
HCT: 34.8 % — ABNORMAL LOW (ref 35.0–45.0)
Hemoglobin: 11.6 g/dL — ABNORMAL LOW (ref 11.7–15.5)
MCH: 31.7 pg (ref 27.0–33.0)
MCHC: 33.3 g/dL (ref 32.0–36.0)
MCV: 95.1 fL (ref 80.0–100.0)
MPV: 9.7 fL (ref 7.5–12.5)
Platelets: 249 10*3/uL (ref 140–400)
RBC: 3.66 10*6/uL — ABNORMAL LOW (ref 3.80–5.10)
RDW: 12.9 % (ref 11.0–15.0)
WBC: 13.3 10*3/uL — ABNORMAL HIGH (ref 3.8–10.8)

## 2020-09-13 LAB — 2HR GTT W 1 HR, CARPENTER, 75 G
Glucose, 1 Hr, Gest: 183 mg/dL — ABNORMAL HIGH (ref 65–179)
Glucose, 2 Hr, Gest: 180 mg/dL — ABNORMAL HIGH (ref 65–152)
Glucose, Fasting, Gest: 86 mg/dL (ref 65–91)

## 2020-09-13 LAB — HIV ANTIBODY (ROUTINE TESTING W REFLEX): HIV 1&2 Ab, 4th Generation: NONREACTIVE

## 2020-09-13 MED ORDER — BLOOD GLUCOSE MONITOR KIT: PACK | 0 refills | Status: DC

## 2020-09-13 NOTE — Telephone Encounter (Signed)
Pt notified via my chart that she tested positive for gestational diabetes.  Referral placed to N&D for teaching.  RX for glucose monitoring sent to her pharmacy.

## 2020-09-14 ENCOUNTER — Encounter: Payer: Self-pay | Admitting: Certified Nurse Midwife

## 2020-09-14 DIAGNOSIS — O24419 Gestational diabetes mellitus in pregnancy, unspecified control: Secondary | ICD-10-CM | POA: Insufficient documentation

## 2020-09-14 LAB — URINE CULTURE, OB REFLEX

## 2020-09-14 LAB — CULTURE, OB URINE

## 2020-09-27 ENCOUNTER — Encounter: Payer: Self-pay | Admitting: Registered"

## 2020-09-27 ENCOUNTER — Encounter: Payer: BC Managed Care – PPO | Attending: Certified Nurse Midwife | Admitting: Registered"

## 2020-09-27 ENCOUNTER — Other Ambulatory Visit: Payer: Self-pay | Admitting: *Deleted

## 2020-09-27 ENCOUNTER — Ambulatory Visit: Payer: BC Managed Care – PPO | Admitting: *Deleted

## 2020-09-27 ENCOUNTER — Ambulatory Visit: Payer: BC Managed Care – PPO | Attending: Obstetrics and Gynecology

## 2020-09-27 ENCOUNTER — Encounter: Payer: Self-pay | Admitting: *Deleted

## 2020-09-27 ENCOUNTER — Other Ambulatory Visit: Payer: Self-pay

## 2020-09-27 DIAGNOSIS — O09523 Supervision of elderly multigravida, third trimester: Secondary | ICD-10-CM

## 2020-09-27 DIAGNOSIS — O24410 Gestational diabetes mellitus in pregnancy, diet controlled: Secondary | ICD-10-CM

## 2020-09-27 DIAGNOSIS — O09522 Supervision of elderly multigravida, second trimester: Secondary | ICD-10-CM | POA: Diagnosis not present

## 2020-09-27 DIAGNOSIS — O24419 Gestational diabetes mellitus in pregnancy, unspecified control: Secondary | ICD-10-CM | POA: Diagnosis not present

## 2020-09-27 DIAGNOSIS — Z362 Encounter for other antenatal screening follow-up: Secondary | ICD-10-CM

## 2020-09-27 DIAGNOSIS — Z3A31 31 weeks gestation of pregnancy: Secondary | ICD-10-CM

## 2020-09-27 NOTE — Progress Notes (Signed)
Patient was seen on 09/27/20 for Gestational Diabetes self-management class [facility]. Pt states dietary changes (~x2 weeks) has been feeling more tired, hungry and thirsty; states drinking less because feels full. Stopped drinking naked juice every day, was eating more bread.   Patient was feeling sick during visit, some of symptoms that were possibly low blood sugar. RD checked; 126 mg/dL. Pt states she ate breakfast 2 hours ago.  The following learning objectives were met by the patient during this course:   States the definition of Gestational Diabetes  States why dietary management is important in controlling blood glucose  Describes the effects each nutrient has on blood glucose levels  Demonstrates ability to create a balanced meal plan  Demonstrates carbohydrate counting   States when to check blood glucose levels  Demonstrates proper blood glucose monitoring techniques  States the effect of stress and exercise on blood glucose levels  States the importance of limiting caffeine and abstaining from alcohol and smoking  Blood glucose monitor given: none. Patient has meter has not started using it yet but did not bring it to the appointment. RD demonstrated and patient used Teach-back to demonstrate understanding.  Patient instructed to monitor glucose levels: FBS: 60 - <95; 1 hour: <140; 2 hour: <120  Patient received handouts:  Nutrition Diabetes and Pregnancy, including carb counting list  Patient will be seen for follow-up as needed.

## 2020-09-29 ENCOUNTER — Ambulatory Visit (INDEPENDENT_AMBULATORY_CARE_PROVIDER_SITE_OTHER): Payer: BC Managed Care – PPO | Admitting: Certified Nurse Midwife

## 2020-09-29 ENCOUNTER — Other Ambulatory Visit: Payer: Self-pay

## 2020-09-29 ENCOUNTER — Encounter: Payer: Self-pay | Admitting: Certified Nurse Midwife

## 2020-09-29 VITALS — BP 106/59 | HR 96 | Wt 165.0 lb

## 2020-09-29 DIAGNOSIS — O2613 Low weight gain in pregnancy, third trimester: Secondary | ICD-10-CM

## 2020-09-29 DIAGNOSIS — Z23 Encounter for immunization: Secondary | ICD-10-CM

## 2020-09-29 DIAGNOSIS — Z3480 Encounter for supervision of other normal pregnancy, unspecified trimester: Secondary | ICD-10-CM

## 2020-09-29 DIAGNOSIS — O24419 Gestational diabetes mellitus in pregnancy, unspecified control: Secondary | ICD-10-CM

## 2020-09-29 NOTE — Progress Notes (Signed)
Urine dip is neg for Glucose and trace Protein.  Neg for Nitrites and leukes

## 2020-09-29 NOTE — Progress Notes (Signed)
Subjective:  Kelsey Cole is a 35 y.o. G2P1001 at [redacted]w[redacted]d being seen today for ongoing prenatal care.  She is currently monitored for the following issues for this low-risk pregnancy and has History of ELISA positive for HSV; Supervision of other normal pregnancy, antepartum; and Gestational diabetes mellitus (GDM) affecting pregnancy on their problem list.  Patient reports no complaints.  Contractions: Irritability. Vag. Bleeding: None.  Movement: Present. Denies leaking of fluid.   The following portions of the patient's history were reviewed and updated as appropriate: allergies, current medications, past family history, past medical history, past social history, past surgical history and problem list. Problem list updated.  Objective:   Vitals:   09/29/20 0905  BP: (!) 106/59  Pulse: 96  Weight: 165 lb (74.8 kg)    Fetal Status: Fetal Heart Rate (bpm): 147 Fundal Height: 32 cm Movement: Present     General:  Alert, oriented and cooperative. Patient is in no acute distress.  Skin: Skin is warm and dry. No rash noted.   Cardiovascular: Normal heart rate noted  Respiratory: Normal respiratory effort, no problems with respiration noted  Abdomen: Soft, gravid, appropriate for gestational age. Pain/Pressure: Absent     Pelvic: Vag. Bleeding: None Vag D/C Character: Thin   Cervical exam deferred        Extremities: Normal range of motion.  Edema: None  Mental Status: Normal mood and affect. Normal behavior. Normal judgment and thought content.   Urinalysis:      Assessment and Plan:  Pregnancy: G2P1001 at [redacted]w[redacted]d  1. Low weight gain during pregnancy in third trimester - normal growth [place] - has f/u US scheduled  2. Supervision of other normal pregnancy, antepartum  3. Gestational diabetes mellitus (GDM) affecting pregnancy -started to log two days ago - FBS 94-96 - PP 89-118 - missed appt with diabetes educator, will call to reschedule - instructed to continue home BS  monitoring and bring log to nv  Preterm labor symptoms and general obstetric precautions including but not limited to vaginal bleeding, contractions, leaking of fluid and fetal movement were reviewed in detail with the patient. Please refer to After Visit Summary for other counseling recommendations.  Return in about 2 weeks (around 10/13/2020).   Donette Larry, CNM

## 2020-09-29 NOTE — Progress Notes (Signed)
FBS 92-94 PP 89-126

## 2020-10-13 ENCOUNTER — Other Ambulatory Visit: Payer: Self-pay

## 2020-10-13 ENCOUNTER — Ambulatory Visit (INDEPENDENT_AMBULATORY_CARE_PROVIDER_SITE_OTHER): Payer: BC Managed Care – PPO | Admitting: Certified Nurse Midwife

## 2020-10-13 VITALS — BP 105/62 | HR 95 | Wt 165.0 lb

## 2020-10-13 DIAGNOSIS — Z3480 Encounter for supervision of other normal pregnancy, unspecified trimester: Secondary | ICD-10-CM

## 2020-10-13 DIAGNOSIS — Z3A33 33 weeks gestation of pregnancy: Secondary | ICD-10-CM

## 2020-10-13 DIAGNOSIS — O24419 Gestational diabetes mellitus in pregnancy, unspecified control: Secondary | ICD-10-CM

## 2020-10-13 NOTE — Progress Notes (Signed)
Subjective:  Kielyn Kardell is a 35 y.o. G2P1001 at [redacted]w[redacted]d being seen today for ongoing prenatal care.  She is currently monitored for the following issues for this low-risk pregnancy and has History of ELISA positive for HSV; Supervision of other normal pregnancy, antepartum; and Gestational diabetes mellitus (GDM) affecting pregnancy on their problem list.  Patient reports no complaints.  Contractions: Irritability. Vag. Bleeding: None.  Movement: Present. Denies leaking of fluid.   The following portions of the patient's history were reviewed and updated as appropriate: allergies, current medications, past family history, past medical history, past social history, past surgical history and problem list. Problem list updated.  Objective:   Vitals:   10/13/20 0853  BP: 105/62  Pulse: 95  Weight: 165 lb (74.8 kg)    Fetal Status: Fetal Heart Rate (bpm): 142 Fundal Height: 34 cm Movement: Present  Presentation: Vertex  General:  Alert, oriented and cooperative. Patient is in no acute distress.  Skin: Skin is warm and dry. No rash noted.   Cardiovascular: Normal heart rate noted  Respiratory: Normal respiratory effort, no problems with respiration noted  Abdomen: Soft, gravid, appropriate for gestational age. Pain/Pressure: Absent     Pelvic: Vag. Bleeding: None Vag D/C Character: Thin   Cervical exam deferred        Extremities: Normal range of motion.  Edema: None  Mental Status: Normal mood and affect. Normal behavior. Normal judgment and thought content.   Urinalysis:      Assessment and Plan:  Pregnancy: G2P1001 at [redacted]w[redacted]d  1. Supervision of other normal pregnancy, antepartum  2. Gestational diabetes mellitus (GDM) affecting pregnancy - review of log; 50% of FBS out of range up to 99; all but 1 pp in range - discussed more diet modifications to improve FBS - recommend review of log in 1 week, if majority of FBS still out of range could consider Metformin; she will send through  MyChart - has f/u with MFM for growth  3. [redacted] weeks gestation  Preterm labor symptoms and general obstetric precautions including but not limited to vaginal bleeding, contractions, leaking of fluid and fetal movement were reviewed in detail with the patient. Please refer to After Visit Summary for other counseling recommendations.  Return in about 3 weeks (around 11/03/2020).   Donette Larry, CNM

## 2020-10-16 ENCOUNTER — Other Ambulatory Visit: Payer: Self-pay | Admitting: Certified Nurse Midwife

## 2020-10-25 ENCOUNTER — Ambulatory Visit: Payer: BC Managed Care – PPO | Admitting: *Deleted

## 2020-10-25 ENCOUNTER — Other Ambulatory Visit: Payer: Self-pay

## 2020-10-25 ENCOUNTER — Encounter: Payer: Self-pay | Admitting: *Deleted

## 2020-10-25 ENCOUNTER — Ambulatory Visit: Payer: BC Managed Care – PPO | Attending: Obstetrics and Gynecology

## 2020-10-25 DIAGNOSIS — O24419 Gestational diabetes mellitus in pregnancy, unspecified control: Secondary | ICD-10-CM | POA: Diagnosis not present

## 2020-10-25 DIAGNOSIS — Z362 Encounter for other antenatal screening follow-up: Secondary | ICD-10-CM | POA: Diagnosis not present

## 2020-10-25 DIAGNOSIS — O24410 Gestational diabetes mellitus in pregnancy, diet controlled: Secondary | ICD-10-CM | POA: Diagnosis not present

## 2020-10-25 DIAGNOSIS — O09523 Supervision of elderly multigravida, third trimester: Secondary | ICD-10-CM

## 2020-10-25 DIAGNOSIS — Z3A35 35 weeks gestation of pregnancy: Secondary | ICD-10-CM | POA: Diagnosis not present

## 2020-11-03 ENCOUNTER — Other Ambulatory Visit: Payer: Self-pay

## 2020-11-03 ENCOUNTER — Ambulatory Visit (INDEPENDENT_AMBULATORY_CARE_PROVIDER_SITE_OTHER): Payer: BC Managed Care – PPO | Admitting: Obstetrics and Gynecology

## 2020-11-03 ENCOUNTER — Other Ambulatory Visit (HOSPITAL_COMMUNITY)
Admission: RE | Admit: 2020-11-03 | Discharge: 2020-11-03 | Disposition: A | Discharge status: Home / Self Care | Payer: BC Managed Care – PPO | Source: Ambulatory Visit | Attending: Obstetrics and Gynecology | Admitting: Obstetrics and Gynecology

## 2020-11-03 VITALS — BP 103/64 | HR 90 | Wt 168.0 lb

## 2020-11-03 DIAGNOSIS — O24419 Gestational diabetes mellitus in pregnancy, unspecified control: Secondary | ICD-10-CM

## 2020-11-03 DIAGNOSIS — Z3480 Encounter for supervision of other normal pregnancy, unspecified trimester: Secondary | ICD-10-CM

## 2020-11-03 NOTE — Progress Notes (Signed)
   PRENATAL VISIT NOTE  Subjective:  Naia Ruff is a 35 y.o. G2P1001 at [redacted]w[redacted]d being seen today for ongoing prenatal care.  She is currently monitored for the following issues for this low-risk pregnancy and has History of ELISA positive for HSV; Supervision of other normal pregnancy, antepartum; and Gestational diabetes mellitus (GDM) affecting pregnancy on their problem list.  Patient reports no complaints.  Contractions: Irritability. Vag. Bleeding: None.  Movement: Present. Denies leaking of fluid.   The following portions of the patient's history were reviewed and updated as appropriate: allergies, current medications, past family history, past medical history, past social history, past surgical history and problem list.   Objective:   Vitals:   11/03/20 1013  BP: 103/64  Pulse: 90  Weight: 168 lb (76.2 kg)    Fetal Status: Fetal Heart Rate (bpm): 147 Fundal Height: 36 cm Movement: Present  Presentation: Vertex  General:  Alert, oriented and cooperative. Patient is in no acute distress.  Skin: Skin is warm and dry. No rash noted.   Cardiovascular: Normal heart rate noted  Respiratory: Normal respiratory effort, no problems with respiration noted  Abdomen: Soft, gravid, appropriate for gestational age.  Pain/Pressure: Present     Pelvic: Cervical exam deferred Dilation: 1.5 Effacement (%): 60 Station: -3  Extremities: Normal range of motion.  Edema: None  Mental Status: Normal mood and affect. Normal behavior. Normal judgment and thought content.   Assessment and Plan:  Pregnancy: G2P1001 at [redacted]w[redacted]d  1. Supervision of other normal pregnancy, antepartum  - Culture, beta strep (group b only) - GC/Chlamydia probe amp ([place])not at [facility]  2. Gestational diabetes mellitus (GDM) affecting pregnancy Week of 2/12: Fasting BS 6/13 readings 95 or greater. 11/39 PP readings > 120.  FBS in the last week all within normal and <95 PP 9/22 readings > 120, with highest being  141 She has stopped eating a protein snack before bed and all FBS having been within normal.  Discussed induction around 39-40 weeks. Has an 36 year old at home and would like to discuss induction day with partner and discuss at next visit.    Preterm labor symptoms and general obstetric precautions including but not limited to vaginal bleeding, contractions, leaking of fluid and fetal movement were reviewed in detail with the patient. Please refer to After Visit Summary for other counseling recommendations.   Return in about 1 week (around 11/10/2020), or in person visit to discuss and schedule IOL.  No future appointments.  Venia Carbon, NP

## 2020-11-07 LAB — GC/CHLAMYDIA PROBE AMP (~~LOC~~) NOT AT ARMC
Chlamydia: NEGATIVE
Comment: NEGATIVE
Comment: NORMAL
Neisseria Gonorrhea: NEGATIVE

## 2020-11-08 ENCOUNTER — Other Ambulatory Visit: Payer: Self-pay | Admitting: *Deleted

## 2020-11-08 MED ORDER — VALACYCLOVIR HCL 500 MG PO TABS: 500.0000 mg | ORAL_TABLET | Freq: Two times a day (BID) | ORAL | 3 refills | Status: DC

## 2020-11-08 NOTE — Progress Notes (Signed)
RX for Valtrex 500 mg sent to [facility] for prophylaxis of HSV during delivery per [employer]

## 2020-11-09 ENCOUNTER — Other Ambulatory Visit: Payer: Self-pay | Admitting: Obstetrics and Gynecology

## 2020-11-09 LAB — CULTURE, BETA STREP (GROUP B ONLY)
MICRO NUMBER:: 11608694
SPECIMEN QUALITY:: ADEQUATE

## 2020-11-09 LAB — HOUSE ACCOUNT TRACKING

## 2020-11-10 ENCOUNTER — Telehealth (HOSPITAL_COMMUNITY): Payer: Self-pay | Admitting: *Deleted

## 2020-11-10 ENCOUNTER — Encounter (HOSPITAL_COMMUNITY): Payer: Self-pay | Admitting: *Deleted

## 2020-11-10 ENCOUNTER — Ambulatory Visit (INDEPENDENT_AMBULATORY_CARE_PROVIDER_SITE_OTHER): Payer: BC Managed Care – PPO | Admitting: Certified Nurse Midwife

## 2020-11-10 ENCOUNTER — Other Ambulatory Visit: Payer: Self-pay

## 2020-11-10 ENCOUNTER — Other Ambulatory Visit: Payer: Self-pay | Admitting: Advanced Practice Midwife

## 2020-11-10 VITALS — BP 107/71 | HR 91 | Wt 167.0 lb

## 2020-11-10 DIAGNOSIS — Z3480 Encounter for supervision of other normal pregnancy, unspecified trimester: Secondary | ICD-10-CM

## 2020-11-10 DIAGNOSIS — Z3A37 37 weeks gestation of pregnancy: Secondary | ICD-10-CM

## 2020-11-10 DIAGNOSIS — O24419 Gestational diabetes mellitus in pregnancy, unspecified control: Secondary | ICD-10-CM

## 2020-11-10 DIAGNOSIS — O24410 Gestational diabetes mellitus in pregnancy, diet controlled: Secondary | ICD-10-CM

## 2020-11-10 NOTE — Telephone Encounter (Signed)
Preadmission screen  

## 2020-11-10 NOTE — Patient Instructions (Signed)
Fetal Movement Counts Patient Name: ________________________________________________ Patient Due Date: ____________________  What is a fetal movement count? A fetal movement count is the number of times that you feel your baby move during a certain amount of time. This may also be called a fetal kick count. A fetal movement count is recommended for every pregnant woman. You may be asked to start counting fetal movements as early as week 28 of your pregnancy. Pay attention to when your baby is most active. You may notice your baby's sleep and wake cycles. You may also notice things that make your baby move more. You should do a fetal movement count:  When your baby is normally most active.  At the same time each day. A good time to count movements is while you are resting, after having something to eat and drink. How do I count fetal movements? 1. Find a quiet, comfortable area. Sit, or lie down on your side. 2. Write down the date, the start time and stop time, and the number of movements that you felt between those two times. Take this information with you to your health care visits. 3. Write down your start time when you feel the first movement. 4. Count kicks, flutters, swishes, rolls, and jabs. You should feel at least 10 movements. 5. You may stop counting after you have felt 10 movements, or if you have been counting for 2 hours. Write down the stop time. 6. If you do not feel 10 movements in 2 hours, contact your health care provider for further instructions. Your health care provider may want to do additional tests to assess your baby's well-being. Contact a health care provider if:  You feel fewer than 10 movements in 2 hours.  Your baby is not moving like he or she usually does. Date: ____________ Start time: ____________ Stop time: ____________ Movements: ____________ Date: ____________ Start time: ____________ Stop time: ____________ Movements: ____________ Date: ____________  Start time: ____________ Stop time: ____________ Movements: ____________ Date: ____________ Start time: ____________ Stop time: ____________ Movements: ____________ Date: ____________ Start time: ____________ Stop time: ____________ Movements: ____________ Date: ____________ Start time: ____________ Stop time: ____________ Movements: ____________ Date: ____________ Start time: ____________ Stop time: ____________ Movements: ____________ Date: ____________ Start time: ____________ Stop time: ____________ Movements: ____________ Date: ____________ Start time: ____________ Stop time: ____________ Movements: ____________ This information is not intended to replace advice given to you by your health care provider. Make sure you discuss any questions you have with your health care provider. Document Revised: 04/08/2019 Document Reviewed: 04/08/2019 Elsevier Patient Education  2021 Elsevier Inc. Rosen's Emergency Medicine: Concepts and Clinical Practice (9th ed., pp. 2296- 2312). Elsevier.">    Braxton Hicks Contractions Contractions of the uterus can occur throughout pregnancy, but they are not always a sign that you are in labor. You may have practice contractions called Braxton Hicks contractions. These false labor contractions are sometimes confused with true labor. What are Braxton Hicks contractions? Braxton Hicks contractions are tightening movements that occur in the muscles of the uterus before labor. Unlike true labor contractions, these contractions do not result in opening (dilation) and thinning of the cervix. Toward the end of pregnancy (32-34 weeks), Braxton Hicks contractions can happen more often and may become stronger. These contractions are sometimes difficult to tell apart from true labor because they can be very uncomfortable. You should not feel embarrassed if you go to the hospital with false labor. Sometimes, the only way to tell if you are in true labor is   for your health care  provider to look for changes in the cervix. The health care provider will do a physical exam and may monitor your contractions. If you are not in true labor, the exam should show that your cervix is not dilating and your water has not broken. If there are no other health problems associated with your pregnancy, it is completely safe for you to be sent home with false labor. You may continue to have Braxton Hicks contractions until you go into true labor. How to tell the difference between true labor and false labor True labor  Contractions last 30-70 seconds.  Contractions become very regular.  Discomfort is usually felt in the top of the uterus, and it spreads to the lower abdomen and low back.  Contractions do not go away with walking.  Contractions usually become more intense and increase in frequency.  The cervix dilates and gets thinner. False labor  Contractions are usually shorter and not as strong as true labor contractions.  Contractions are usually irregular.  Contractions are often felt in the front of the lower abdomen and in the groin.  Contractions may go away when you walk around or change positions while lying down.  Contractions get weaker and are shorter-lasting as time goes on.  The cervix usually does not dilate or become thin. Follow these instructions at home:  Take over-the-counter and prescription medicines only as told by your health care provider.  Keep up with your usual exercises and follow other instructions from your health care provider.  Eat and drink lightly if you think you are going into labor.  If Braxton Hicks contractions are making you uncomfortable: ? Change your position from lying down or resting to walking, or change from walking to resting. ? Sit and rest in a tub of warm water. ? Drink enough fluid to keep your urine pale yellow. Dehydration may cause these contractions. ? Do slow and deep breathing several times an hour.  Keep  all follow-up prenatal visits as told by your health care provider. This is important.   Contact a health care provider if:  You have a fever.  You have continuous pain in your abdomen. Get help right away if:  Your contractions become stronger, more regular, and closer together.  You have fluid leaking or gushing from your vagina.  You pass blood-tinged mucus (bloody show).  You have bleeding from your vagina.  You have low back pain that you never had before.  You feel your baby's head pushing down and causing pelvic pressure.  Your baby is not moving inside you as much as it used to. Summary  Contractions that occur before labor are called Braxton Hicks contractions, false labor, or practice contractions.  Braxton Hicks contractions are usually shorter, weaker, farther apart, and less regular than true labor contractions. True labor contractions usually become progressively stronger and regular, and they become more frequent.  Manage discomfort from Braxton Hicks contractions by changing position, resting in a warm bath, drinking plenty of water, or practicing deep breathing. This information is not intended to replace advice given to you by your health care provider. Make sure you discuss any questions you have with your health care provider. Document Revised: 08/01/2017 Document Reviewed: 01/02/2017 Elsevier Patient Education  2021 Elsevier Inc.  

## 2020-11-10 NOTE — Progress Notes (Signed)
Subjective:  Kelsey Cole is a 35 y.o. G2P1001 at [redacted]w[redacted]d being seen today for ongoing prenatal care.  She is currently monitored for the following issues for this low-risk pregnancy and has History of ELISA positive for HSV; Supervision of other normal pregnancy, antepartum; and Gestational diabetes mellitus (GDM) affecting pregnancy on their problem list.  Patient reports no complaints.  Contractions: Irritability. Vag. Bleeding: None.  Movement: Present. Denies leaking of fluid.   The following portions of the patient's history were reviewed and updated as appropriate: allergies, current medications, past family history, past medical history, past social history, past surgical history and problem list. Problem list updated.  Objective:   Vitals:   11/10/20 0926  BP: 107/71  Pulse: 91  Weight: 167 lb (75.8 kg)    Fetal Status: Fetal Heart Rate (bpm): 145 Fundal Height: 37 cm Movement: Present  Presentation: Vertex  General:  Alert, oriented and cooperative. Patient is in no acute distress.  Skin: Skin is warm and dry. No rash noted.   Cardiovascular: Normal heart rate noted  Respiratory: Normal respiratory effort, no problems with respiration noted  Abdomen: Soft, gravid, appropriate for gestational age. Pain/Pressure: Present     Pelvic: Vag. Bleeding: None Vag D/C Character: Thin   Cervical exam performed Dilation: 3.5 Effacement (%): 70 Station: -2  Extremities: Normal range of motion.  Edema: None  Mental Status: Normal mood and affect. Normal behavior. Normal judgment and thought content.   Urinalysis:      Assessment and Plan:  Pregnancy: G2P1001 at [redacted]w[redacted]d  1. GDM, class A1 - [place] MFM OB FOLLOW UP; Future >for growth - bs log reviewed, all in range except a few slightly elevated postprandials - plan for delivery at 39-40 week - pt prefers IOL at 39 weeks (11/19/20) d/t partners work schedule; discussed increased risk of induction (fetal distress, CS) vs spontaneous labor;  wished to proceed with IOL  2. Supervision of other normal pregnancy, antepartum  3. [redacted] weeks gestation of pregnancy  Term labor symptoms and general obstetric precautions including but not limited to vaginal bleeding, contractions, leaking of fluid and fetal movement were reviewed in detail with the patient. Please refer to After Visit Summary for other counseling recommendations.  Return in about 1 week (around 11/17/2020).   Donette Larry, CNM

## 2020-11-17 ENCOUNTER — Encounter: Payer: Self-pay | Admitting: *Deleted

## 2020-11-17 ENCOUNTER — Ambulatory Visit (INDEPENDENT_AMBULATORY_CARE_PROVIDER_SITE_OTHER): Payer: BC Managed Care – PPO | Admitting: Certified Nurse Midwife

## 2020-11-17 ENCOUNTER — Ambulatory Visit: Payer: BC Managed Care – PPO | Admitting: *Deleted

## 2020-11-17 ENCOUNTER — Other Ambulatory Visit (HOSPITAL_COMMUNITY)
Admission: RE | Admit: 2020-11-17 | Discharge: 2020-11-17 | Disposition: A | Discharge status: Home / Self Care | Payer: BC Managed Care – PPO | Source: Ambulatory Visit | Attending: Family Medicine | Admitting: Family Medicine

## 2020-11-17 ENCOUNTER — Ambulatory Visit (HOSPITAL_BASED_OUTPATIENT_CLINIC_OR_DEPARTMENT_OTHER): Payer: BC Managed Care – PPO

## 2020-11-17 ENCOUNTER — Other Ambulatory Visit: Payer: Self-pay

## 2020-11-17 VITALS — BP 108/68 | HR 96 | Wt 168.0 lb

## 2020-11-17 DIAGNOSIS — O24410 Gestational diabetes mellitus in pregnancy, diet controlled: Secondary | ICD-10-CM | POA: Insufficient documentation

## 2020-11-17 DIAGNOSIS — O24420 Gestational diabetes mellitus in childbirth, diet controlled: Secondary | ICD-10-CM | POA: Diagnosis not present

## 2020-11-17 DIAGNOSIS — O24419 Gestational diabetes mellitus in pregnancy, unspecified control: Secondary | ICD-10-CM

## 2020-11-17 DIAGNOSIS — O322XX0 Maternal care for transverse and oblique lie, not applicable or unspecified: Secondary | ICD-10-CM | POA: Diagnosis not present

## 2020-11-17 DIAGNOSIS — Z0542 Observation and evaluation of newborn for suspected metabolic condition ruled out: Secondary | ICD-10-CM | POA: Diagnosis not present

## 2020-11-17 DIAGNOSIS — O09523 Supervision of elderly multigravida, third trimester: Secondary | ICD-10-CM | POA: Insufficient documentation

## 2020-11-17 DIAGNOSIS — Z01812 Encounter for preprocedural laboratory examination: Secondary | ICD-10-CM | POA: Insufficient documentation

## 2020-11-17 DIAGNOSIS — Z3A38 38 weeks gestation of pregnancy: Secondary | ICD-10-CM | POA: Insufficient documentation

## 2020-11-17 DIAGNOSIS — Z3480 Encounter for supervision of other normal pregnancy, unspecified trimester: Secondary | ICD-10-CM

## 2020-11-17 DIAGNOSIS — Z3A39 39 weeks gestation of pregnancy: Secondary | ICD-10-CM | POA: Diagnosis not present

## 2020-11-17 DIAGNOSIS — O9832 Other infections with a predominantly sexual mode of transmission complicating childbirth: Secondary | ICD-10-CM | POA: Diagnosis not present

## 2020-11-17 DIAGNOSIS — Z20822 Contact with and (suspected) exposure to covid-19: Secondary | ICD-10-CM | POA: Diagnosis not present

## 2020-11-17 DIAGNOSIS — Z302 Encounter for sterilization: Secondary | ICD-10-CM | POA: Diagnosis not present

## 2020-11-17 DIAGNOSIS — O326XX0 Maternal care for compound presentation, not applicable or unspecified: Secondary | ICD-10-CM | POA: Diagnosis not present

## 2020-11-17 DIAGNOSIS — Z23 Encounter for immunization: Secondary | ICD-10-CM | POA: Diagnosis not present

## 2020-11-17 DIAGNOSIS — A6000 Herpesviral infection of urogenital system, unspecified: Secondary | ICD-10-CM | POA: Diagnosis not present

## 2020-11-17 DIAGNOSIS — O24429 Gestational diabetes mellitus in childbirth, unspecified control: Secondary | ICD-10-CM | POA: Diagnosis not present

## 2020-11-17 LAB — SARS CORONAVIRUS 2 (TAT 6-24 HRS): SARS Coronavirus 2: NEGATIVE

## 2020-11-17 NOTE — Progress Notes (Signed)
Subjective:  Lelia Jons is a 35 y.o. G2P1001 at [redacted]w[redacted]d being seen today for ongoing prenatal care.  She is currently monitored for the following issues for this low-risk pregnancy and has History of ELISA positive for HSV; Supervision of other normal pregnancy, antepartum; and Gestational diabetes mellitus (GDM) affecting pregnancy on their problem list.  Patient reports no complaints.  Contractions: Irritability. Vag. Bleeding: None.  Movement: Present. Denies leaking of fluid.   The following portions of the patient's history were reviewed and updated as appropriate: allergies, current medications, past family history, past medical history, past social history, past surgical history and problem list. Problem list updated.  Objective:   Vitals:   11/17/20 1046  BP: 108/68  Pulse: 96  Weight: 168 lb (76.2 kg)    Fetal Status: Fetal Heart Rate (bpm): 131 Fundal Height: 38 cm Movement: Present  Presentation: Vertex  General:  Alert, oriented and cooperative. Patient is in no acute distress.  Skin: Skin is warm and dry. No rash noted.   Cardiovascular: Normal heart rate noted  Respiratory: Normal respiratory effort, no problems with respiration noted  Abdomen: Soft, gravid, appropriate for gestational age. Pain/Pressure: Present     Pelvic: Vag. Bleeding: None Vag D/C Character: Thin   Cervical exam performed        Extremities: Normal range of motion.  Edema: None  Mental Status: Normal mood and affect. Normal behavior. Normal judgment and thought content.   Urinalysis:      Assessment and Plan:  Pregnancy: G2P1001 at [redacted]w[redacted]d  1. [redacted] weeks gestation of pregnancy   2. Supervision of other normal pregnancy, antepartum   3. Gestational diabetes mellitus (GDM) affecting pregnancy -IOL scheduled 11/19/20  Term labor symptoms and general obstetric precautions including but not limited to vaginal bleeding, contractions, leaking of fluid and fetal movement were reviewed in detail  with the patient. Please refer to After Visit Summary for other counseling recommendations.  Return in about 1 week (around 11/24/2020).   Donette Larry, CNM

## 2020-11-18 NOTE — Progress Notes (Signed)
Pt scheduled at midnight for IOL per Dr. Charlotta Newton ok to hold pt till in the morning will call pt in when we have bed and staff. Pt notified all questions answered. Instructed pt what to seek care for in [facility].

## 2020-11-19 ENCOUNTER — Inpatient Hospital Stay (HOSPITAL_COMMUNITY)
Admission: AD | Admit: 2020-11-19 | Payer: BC Managed Care – PPO | Source: Home / Self Care | Admitting: Obstetrics and Gynecology

## 2020-11-19 ENCOUNTER — Inpatient Hospital Stay (HOSPITAL_COMMUNITY)
Admission: AD | Admit: 2020-11-19 | Discharge: 2020-11-21 | DRG: 797 | Disposition: A | Discharge status: Home / Self Care | Payer: BC Managed Care – PPO | Attending: Obstetrics & Gynecology | Admitting: Obstetrics & Gynecology

## 2020-11-19 ENCOUNTER — Inpatient Hospital Stay (HOSPITAL_COMMUNITY): Payer: BC Managed Care – PPO

## 2020-11-19 ENCOUNTER — Encounter (HOSPITAL_COMMUNITY): Payer: Self-pay | Admitting: Obstetrics & Gynecology

## 2020-11-19 ENCOUNTER — Other Ambulatory Visit: Payer: Self-pay

## 2020-11-19 ENCOUNTER — Inpatient Hospital Stay (HOSPITAL_COMMUNITY): Payer: BC Managed Care – PPO | Attending: Obstetrics & Gynecology

## 2020-11-19 ENCOUNTER — Inpatient Hospital Stay (HOSPITAL_COMMUNITY): Payer: BC Managed Care – PPO | Admitting: Anesthesiology

## 2020-11-19 DIAGNOSIS — Z20822 Contact with and (suspected) exposure to covid-19: Secondary | ICD-10-CM | POA: Diagnosis present

## 2020-11-19 DIAGNOSIS — O9832 Other infections with a predominantly sexual mode of transmission complicating childbirth: Secondary | ICD-10-CM | POA: Diagnosis present

## 2020-11-19 DIAGNOSIS — Z302 Encounter for sterilization: Secondary | ICD-10-CM | POA: Diagnosis not present

## 2020-11-19 DIAGNOSIS — Z3A39 39 weeks gestation of pregnancy: Secondary | ICD-10-CM

## 2020-11-19 DIAGNOSIS — A6000 Herpesviral infection of urogenital system, unspecified: Secondary | ICD-10-CM | POA: Diagnosis present

## 2020-11-19 DIAGNOSIS — O322XX0 Maternal care for transverse and oblique lie, not applicable or unspecified: Secondary | ICD-10-CM | POA: Diagnosis present

## 2020-11-19 DIAGNOSIS — O326XX0 Maternal care for compound presentation, not applicable or unspecified: Secondary | ICD-10-CM | POA: Diagnosis not present

## 2020-11-19 DIAGNOSIS — O24419 Gestational diabetes mellitus in pregnancy, unspecified control: Secondary | ICD-10-CM | POA: Diagnosis present

## 2020-11-19 DIAGNOSIS — Z3480 Encounter for supervision of other normal pregnancy, unspecified trimester: Secondary | ICD-10-CM

## 2020-11-19 DIAGNOSIS — Z9079 Acquired absence of other genital organ(s): Secondary | ICD-10-CM

## 2020-11-19 DIAGNOSIS — O24420 Gestational diabetes mellitus in childbirth, diet controlled: Secondary | ICD-10-CM | POA: Diagnosis present

## 2020-11-19 LAB — TYPE AND SCREEN
ABO/RH(D): O POS
Antibody Screen: NEGATIVE

## 2020-11-19 LAB — GLUCOSE, CAPILLARY
Glucose-Capillary: 116 mg/dL — ABNORMAL HIGH (ref 70–99)
Glucose-Capillary: 109 mg/dL — ABNORMAL HIGH (ref 70–99)

## 2020-11-19 LAB — CBC
HCT: 35.8 % — ABNORMAL LOW (ref 36.0–46.0)
Hemoglobin: 12.5 g/dL (ref 12.0–15.0)
MCH: 33.4 pg (ref 26.0–34.0)
MCHC: 34.9 g/dL (ref 30.0–36.0)
MCV: 95.7 fL (ref 80.0–100.0)
Platelets: 228 10*3/uL (ref 150–400)
RBC: 3.74 MIL/uL — ABNORMAL LOW (ref 3.87–5.11)
RDW: 15.3 % (ref 11.5–15.5)
WBC: 8.2 10*3/uL (ref 4.0–10.5)
nRBC: 0 % (ref 0.0–0.2)

## 2020-11-19 LAB — RPR: RPR Ser Ql: NONREACTIVE

## 2020-11-19 MED ORDER — OXYTOCIN-SODIUM CHLORIDE 30-0.9 UT/500ML-% IV SOLN
1.0000 m[IU]/min | INTRAVENOUS | Status: DC
  Administered 2020-11-19: 2 m[IU]/min via INTRAVENOUS
  Filled 2020-11-19: qty 500

## 2020-11-19 MED ORDER — SENNOSIDES-DOCUSATE SODIUM 8.6-50 MG PO TABS
2.0000 | ORAL_TABLET | Freq: Every day | ORAL | Status: DC
  Administered 2020-11-21: 2 via ORAL
  Filled 2020-11-19: qty 2

## 2020-11-19 MED ORDER — LACTATED RINGERS IV SOLN: INTRAVENOUS | Status: DC

## 2020-11-19 MED ORDER — OXYTOCIN-SODIUM CHLORIDE 30-0.9 UT/500ML-% IV SOLN: 2.5000 [IU]/h | INTRAVENOUS | Status: DC

## 2020-11-19 MED ORDER — ACETAMINOPHEN 325 MG PO TABS
650.0000 mg | ORAL_TABLET | Freq: Four times a day (QID) | ORAL | Status: DC
  Administered 2020-11-19 – 2020-11-21 (×5): 650 mg via ORAL
  Filled 2020-11-19 (×5): qty 2

## 2020-11-19 MED ORDER — ONDANSETRON HCL 4 MG PO TABS: 4.0000 mg | ORAL_TABLET | ORAL | Status: DC | PRN

## 2020-11-19 MED ORDER — EPHEDRINE 5 MG/ML INJ: 10.0000 mg | INTRAVENOUS | Status: DC | PRN

## 2020-11-19 MED ORDER — TETANUS-DIPHTH-ACELL PERTUSSIS 5-2.5-18.5 LF-MCG/0.5 IM SUSY: 0.5000 mL | PREFILLED_SYRINGE | Freq: Once | INTRAMUSCULAR | Status: DC

## 2020-11-19 MED ORDER — FENTANYL-BUPIVACAINE-NACL 0.5-0.125-0.9 MG/250ML-% EP SOLN
12.0000 mL/h | EPIDURAL | Status: DC | PRN
  Administered 2020-11-19: 12 mL/h via EPIDURAL
  Filled 2020-11-19: qty 250

## 2020-11-19 MED ORDER — LIDOCAINE HCL (PF) 1 % IJ SOLN
30.0000 mL | INTRAMUSCULAR | Status: DC | PRN
Start: 2020-11-19 — End: 2020-11-19

## 2020-11-19 MED ORDER — LACTATED RINGERS IV SOLN: 500.0000 mL | Freq: Once | INTRAVENOUS | Status: DC

## 2020-11-19 MED ORDER — ONDANSETRON HCL 4 MG/2ML IJ SOLN: 4.0000 mg | Freq: Four times a day (QID) | INTRAMUSCULAR | Status: DC | PRN

## 2020-11-19 MED ORDER — PHENYLEPHRINE 40 MCG/ML (10ML) SYRINGE FOR IV PUSH (FOR BLOOD PRESSURE SUPPORT)
80.0000 ug | PREFILLED_SYRINGE | INTRAVENOUS | Status: DC | PRN
  Filled 2020-11-19: qty 10

## 2020-11-19 MED ORDER — PHENYLEPHRINE 40 MCG/ML (10ML) SYRINGE FOR IV PUSH (FOR BLOOD PRESSURE SUPPORT): 80.0000 ug | PREFILLED_SYRINGE | INTRAVENOUS | Status: DC | PRN

## 2020-11-19 MED ORDER — TERBUTALINE SULFATE 1 MG/ML IJ SOLN
0.2500 mg | Freq: Once | INTRAMUSCULAR | Status: DC | PRN
Start: 2020-11-19 — End: 2020-11-19

## 2020-11-19 MED ORDER — ONDANSETRON HCL 4 MG/2ML IJ SOLN: 4.0000 mg | INTRAMUSCULAR | Status: DC | PRN

## 2020-11-19 MED ORDER — OXYTOCIN BOLUS FROM INFUSION
333.0000 mL | Freq: Once | INTRAVENOUS | Status: AC
  Administered 2020-11-19: 333 mL via INTRAVENOUS

## 2020-11-19 MED ORDER — DIPHENHYDRAMINE HCL 25 MG PO CAPS: 25.0000 mg | ORAL_CAPSULE | Freq: Four times a day (QID) | ORAL | Status: DC | PRN

## 2020-11-19 MED ORDER — SIMETHICONE 80 MG PO CHEW: 80.0000 mg | CHEWABLE_TABLET | ORAL | Status: DC | PRN

## 2020-11-19 MED ORDER — OXYCODONE-ACETAMINOPHEN 5-325 MG PO TABS: 2.0000 | ORAL_TABLET | ORAL | Status: DC | PRN

## 2020-11-19 MED ORDER — ACETAMINOPHEN 325 MG PO TABS: 650.0000 mg | ORAL_TABLET | ORAL | Status: DC | PRN

## 2020-11-19 MED ORDER — DIBUCAINE (PERIANAL) 1 % EX OINT: 1.0000 "application " | TOPICAL_OINTMENT | CUTANEOUS | Status: DC | PRN

## 2020-11-19 MED ORDER — SOD CITRATE-CITRIC ACID 500-334 MG/5ML PO SOLN: 30.0000 mL | ORAL | Status: DC | PRN

## 2020-11-19 MED ORDER — LACTATED RINGERS IV SOLN: 500.0000 mL | INTRAVENOUS | Status: DC | PRN

## 2020-11-19 MED ORDER — COCONUT OIL OIL: 1.0000 "application " | TOPICAL_OIL | Status: DC | PRN

## 2020-11-19 MED ORDER — LIDOCAINE-EPINEPHRINE (PF) 2 %-1:200000 IJ SOLN
INTRAMUSCULAR | Status: DC | PRN
  Administered 2020-11-19: 4 mL via EPIDURAL

## 2020-11-19 MED ORDER — DIPHENHYDRAMINE HCL 50 MG/ML IJ SOLN: 12.5000 mg | INTRAMUSCULAR | Status: DC | PRN

## 2020-11-19 MED ORDER — PRENATAL MULTIVITAMIN CH: 1.0000 | ORAL_TABLET | Freq: Every day | ORAL | Status: DC

## 2020-11-19 MED ORDER — OXYCODONE-ACETAMINOPHEN 5-325 MG PO TABS: 1.0000 | ORAL_TABLET | ORAL | Status: DC | PRN

## 2020-11-19 MED ORDER — TERBUTALINE SULFATE 1 MG/ML IJ SOLN: 0.2500 mg | Freq: Once | INTRAMUSCULAR | Status: DC | PRN

## 2020-11-19 MED ORDER — WITCH HAZEL-GLYCERIN EX PADS: 1.0000 "application " | MEDICATED_PAD | CUTANEOUS | Status: DC | PRN

## 2020-11-19 MED ORDER — IBUPROFEN 600 MG PO TABS
600.0000 mg | ORAL_TABLET | Freq: Four times a day (QID) | ORAL | Status: DC
  Administered 2020-11-19 – 2020-11-21 (×5): 600 mg via ORAL
  Filled 2020-11-19 (×5): qty 1

## 2020-11-19 MED ORDER — BENZOCAINE-MENTHOL 20-0.5 % EX AERO: 1.0000 "application " | INHALATION_SPRAY | CUTANEOUS | Status: DC | PRN

## 2020-11-19 NOTE — Discharge Summary (Addendum)
Postpartum Discharge Summary     Patient Name: Kelsey Cole DOB: 05-12-1986 MRN: [id number]  Date of admission: 11/19/2020 Delivery date:11/19/2020  Delivering provider: Randa Ngo  Date of discharge: 11/21/2020  Admitting diagnosis: Gestational diabetes [O24.419] Intrauterine pregnancy: [redacted]w[redacted]d    Secondary diagnosis:  Principal Problem:   Vaginal delivery Active Problems:   Supervision of other normal pregnancy, antepartum   Gestational diabetes mellitus (GDM) affecting pregnancy   Gestational diabetes   H/O bilateral salpingectomy  Additional problems: as noted above  Discharge diagnosis: Term Pregnancy Delivered                                              Post partum procedures:postpartum tubal ligation Augmentation: AROM and Pitocin Complications: None  Hospital course: Induction of Labor With Vaginal Delivery   35y.o. yo G2P1001 at 32w0das admitted to the hospital 11/19/2020 for induction of labor.  Indication for induction: A1 DM.  Patient had an uncomplicated labor course as follows: Membrane Rupture Time/Date: 4:00 PM ,11/19/2020   Delivery Method:Vaginal, Spontaneous  Episiotomy: None none Lacerations:  None hemostatic first degree perineal laceration Details of delivery can be found in separate delivery note.  Patient had a routine postpartum course. Patient is discharged home 11/21/20.  Newborn Data: Birth date:11/19/2020  Birth time:7:15 PM  Gender:Female  Living status:Living  Apgars:9 ,9  Weight:3025 g   Magnesium Sulfate received: No BMZ received: No Rhophylac:N/A MMR:N/A T-DaP:Given prenatally Flu: Yes Transfusion:No  Physical exam  Vitals:   11/20/20 1941 11/20/20 2112 11/21/20 0149 11/21/20 0538  BP:  108/63 112/74 (!) 101/59  Pulse:  77 73 70  Resp:  16 16 16   Temp:  98.9 F (37.2 C) 98.6 F (37 C) 98.7 F (37.1 C)  TempSrc:  Oral Oral Oral  SpO2: 100% 97% 99% 100%  Weight:      Height:       General: alert, cooperative  and no distress Lochia: appropriate Uterine Fundus: firm Incision: Healing well with no significant drainage, No significant erythema, Dressing is clean, dry, and intact DVT Evaluation: No evidence of DVT seen on physical exam. Labs: Lab Results  Component Value Date   WBC 8.2 11/19/2020   HGB 12.5 11/19/2020   HCT 35.8 (L) 11/19/2020   MCV 95.7 11/19/2020   PLT 228 11/19/2020   CMP Latest Ref Rng & Units 10/15/2018  Glucose 65 - 99 mg/dL 89  BUN 7 - 25 mg/dL 10  Creatinine 0.50 - 1.10 mg/dL 0.64  Sodium 135 - 146 mmol/L 142  Potassium 3.5 - 5.3 mmol/L 3.6  Chloride 98 - 110 mmol/L 104  CO2 20 - 32 mmol/L 27  Calcium 8.6 - 10.2 mg/dL 9.4  Total Protein 6.1 - 8.1 g/dL 7.3  Total Bilirubin 0.2 - 1.2 mg/dL 0.4  Alkaline Phos 33 - 115 U/L -  AST 10 - 30 U/L 15  ALT 6 - 29 U/L 13   Edinburgh Score: Edinburgh Postnatal Depression Scale Screening Tool 11/19/2020  I have been able to laugh and see the funny side of things. 0  I have looked forward with enjoyment to things. 0  I have blamed myself unnecessarily when things went wrong. 0  I have been anxious or worried for no good reason. 0  I have felt scared or panicky for no good reason. 0  Things have been  getting on top of me. 1  I have been so unhappy that I have had difficulty sleeping. 0  I have felt sad or miserable. 0  I have been so unhappy that I have been crying. 1  The thought of harming myself has occurred to me. 0  Edinburgh Postnatal Depression Scale Total 2     After visit meds:  Allergies as of 11/21/2020   No Known Allergies     Medication List    STOP taking these medications   ondansetron 4 MG disintegrating tablet Commonly known as: Zofran ODT   valACYclovir 500 MG tablet Commonly known as: Valtrex     TAKE these medications   blood glucose meter kit and supplies Kit Dispense based on patient and insurance preference. Use up to four times daily as directed. (FOR ICD-9 250.00, 250.01).    Comfort Fit Maternity Supp Lg Misc 1 Units by Does not apply route daily.   prenatal multivitamin Tabs tablet Take 1 tablet by mouth daily at 12 noon.        Discharge home in stable condition Infant Feeding: Breast Infant Disposition:home with mother Discharge instruction: per After Visit Summary and Postpartum booklet. Activity: Advance as tolerated. Pelvic rest for 6 weeks.  Diet: routine diet Future Appointments: Future Appointments  Date Time Provider [place]  12/18/2020  9:45 AM Chancy Milroy, MD [facility]   Follow up Visit: Message sent to [facility] by Dr. Astrid Drafts.  Please schedule this patient for a In person postpartum visit in 6 weeks with the following provider: Any provider. Additional Postpartum F/U:2 hour GTT at postpartum appt Low risk pregnancy complicated by: [id number] Delivery mode:  Vaginal, Spontaneous  Anticipated Birth Control:  BTL done [facility]  11/21/2020 Gifford Shave, MD   GME ATTESTATION:  I saw and evaluated the patient. I agree with the findings and the plan of care as documented in the residents note.  Arrie Senate, MD OB Fellow, [place] [facility] [place] 11/21/2020 8:39 AM

## 2020-11-19 NOTE — Progress Notes (Addendum)
Kelsey Cole is a 35 y.o. G2P1001 at [redacted]w[redacted]d admitted for induction of labor secondary to Gestational diabetes A1.  Subjective: Patient feeling pain with contractions.   Objective: BP 116/72   Pulse 78   Temp 98 F (36.7 C) (Oral)   Resp 16   Ht 5\' 1"  (1.549 m)   Wt 76.2 kg   LMP 02/20/2020 (Approximate)   BMI 31.74 kg/m  No intake/output data recorded. No intake/output data recorded.  FHT:  FHR: 140 bpm, variability: moderate,  accelerations:  Present,  decelerations:  Absent UC:  regular, every 2-3 minutes SVE:   Dilation: 6 Effacement (%): 80 Station: -1 Exam by: 02/22/2020, MD  Labs: Lab Results  Component Value Date   WBC 8.2 11/19/2020   HGB 12.5 11/19/2020   HCT 35.8 (L) 11/19/2020   MCV 95.7 11/19/2020   PLT 228 11/19/2020    Assessment / Plan: Induction of labor due to gestational diabetes,  progressing well on pitocin  Labor: Progressing slowly, 1 cm change in dilation over 4-5 hours. AROM for clear fluid at 1600.  Fetal Wellbeing:  Category I Pain Control:  prepping for epidural I/D:  Negative A1GDM: BG at goal. Will continue to trend q4hr in labor. Anticipated MOD:  NSVD  11/21/2020 Muus 11/19/2020, 4:15 PM  Attestation of Supervision of Student:  I confirm that I have verified the information documented in the  resident's   note and that I have also personally reperformed the history, physical exam and all medical decision making activities.  I have verified that all services and findings are accurately documented in this student's note; and I agree with management and plan as outlined in the documentation. I have also made any necessary editorial changes.  11/21/2020, MD [facility], [facility] 11/19/2020 5:41 PM

## 2020-11-19 NOTE — H&P (Addendum)
OBSTETRIC ADMISSION HISTORY AND PHYSICAL  Kelsey Cole is a 35 y.o. female G2P1001 with IUP at 4w0dby L/11 presenting for IOL for A1GDM. She reports +FMs, No LOF, no VB, no blurry vision, headaches or peripheral edema, and RUQ pain.  She plans on breast feeding. She requests postpartum BTL for birth control.  [facility]  Dating: L/11 --->  Estimated Date of Delivery: 11/26/20  Sono: _0 , CWD, normal anatomy, cephalic presentation, [id number] 50% EFW  Prenatal History/Complications: GDMA1, AMA  Past Medical History:  Diagnosis Date  . Gestational diabetes   . 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 No pertinent surgical history. OB History    Gravida  2   Para  1   Term  1   Preterm      AB      Living  1     SAB      IAB      Ectopic      Multiple      Live Births  1          Social History   Socioeconomic History  . Marital status: Married    Spouse name: EStefan Church . Number of children: 1  . Years of education: college  . Highest education level: Not on file  Occupational History  . Occupation: stay at home mom  Tobacco Use  . Smoking status: Never Smoker  . Smokeless tobacco: Never Used  Vaping Use  . Vaping Use: Never used  Substance and Sexual Activity  . Alcohol use: Not Currently    Alcohol/week: 0.0 standard drinks  . Drug use: Never  . Sexual activity: Yes    Partners: Male    Birth control/protection: None  Other Topics Concern  . Not on file  Social History Narrative   [place]   Lives with her husband and their son (born 2013).   Social Determinants of Health   Financial Resource Strain: Not on file  Food Insecurity: Not on file  Transportation Needs: Not on file  Physical Activity: Not on file  Stress: Not on file  Social Connections: Not on file   Family History  Problem Relation Age of Onset  . Hypertension Mother    . Diabetes Father   . Hypertension Paternal Uncle    No Known Allergies  Medications Prior to Admission  Medication Sig Dispense Refill Last Dose  . blood glucose meter kit and supplies KIT Dispense based on patient and insurance preference. Use up to four times daily as directed. (FOR ICD-9 250.00, 250.01). 1 each 0   . Elastic Bandages & Supports (COMFORT FIT MATERNITY SUPP LG) MISC 1 Units by Does not apply route daily. 1 each 0   . ondansetron (ZOFRAN ODT) 4 MG disintegrating tablet Take 1 tablet (4 mg total) by mouth every 8 (eight) hours as needed for nausea or vomiting. 30 tablet 1   . OneTouch Delica Lancets 347WMISC SMARTSIG:1 Each Topical 4 Times Daily     . ONETOUCH ULTRA test strip 1 each 4 (four) times daily.     . Prenatal Vit-Fe Fumarate-FA (PRENATAL VITAMIN PO) Take by mouth.     . valACYclovir (VALTREX) 500 MG tablet Take 1 tablet (500 mg total) by mouth 2 (two) times daily. 60 tablet 3     Review of Systems  All systems reviewed and negative except as  stated in HPI  Blood pressure 92/63, pulse (!) 108, temperature 98 F (36.7 C), temperature source Oral, resp. rate 16, height _0  (1.549 m), weight 76.2 kg, last menstrual period 02/20/2020. General appearance: alert and cooperative Lungs: normal WOB Heart: regular rate  Abdomen: soft, non-tender Extremities:no sign of DVT  Presentation: cephalic Fetal monitoring: Baseline: 145 bpm, Variability: Good {> 6 bpm), Accelerations: Reactive, and Decelerations: Absent Uterine activity: Frequency - Every minutes Dilation: 5 Effacement (%): 50 Station: -1 Exam by:: lee   Prenatal labs: ABO, Rh: --/--/O POS (03/20 6389) Antibody: NEG (03/20 3734) Rubella: 4.78 (09/07 1004) RPR: NON-REACTIVE (01/11 0913)  HBsAg: NON-REACTIVE (09/07 1004)  HIV: NON-REACTIVE (01/11 0913)  GBS:   Negative 2 hr Glucola 86/183/180 Genetic screening  normal Anatomy US normal  Prenatal Transfer Tool  Maternal Diabetes: Yes:   Diabetes Type:  Diet controlled Genetic Screening: Normal Maternal Ultrasounds/Referrals: Normal Fetal Ultrasounds or other Referrals:  None Maternal Substance Abuse:  No Significant Maternal Medications:  None Significant Maternal Lab Results: GBS negative  Results for orders placed or performed during the hospital encounter of 11/19/20 (from the past 24 hour(s))  Glucose, capillary   Collection Time: 11/19/20  9:23 AM  Result Value Ref Range   Glucose-Capillary 116 (H) 70 - 99 mg/dL  CBC   Collection Time: 11/19/20  9:26 AM  Result Value Ref Range   WBC 8.2 4.0 - 10.5 K/uL   RBC 3.74 (L) 3.87 - 5.11 MIL/uL   Hemoglobin 12.5 12.0 - 15.0 g/dL   HCT 35.8 (L) 36.0 - 46.0 %   MCV 95.7 80.0 - 100.0 fL   MCH 33.4 26.0 - 34.0 pg   MCHC 34.9 30.0 - 36.0 g/dL   RDW 15.3 11.5 - 15.5 %   Platelets 228 150 - 400 K/uL   nRBC 0.0 0.0 - 0.2 %  Type and screen [place]   Collection Time: 11/19/20  9:26 AM  Result Value Ref Range   ABO/RH(D) O POS    Antibody Screen NEG    Sample Expiration      11/22/2020,2359 Performed at [facility], [place] [address] [place], [place] [address]    Patient Active Problem List   Diagnosis Date Noted  . Gestational diabetes 11/19/2020  . Gestational diabetes mellitus (GDM) affecting pregnancy 09/14/2020  . Supervision of other normal pregnancy, antepartum 05/09/2020  . History of ELISA positive for HSV 10/16/2018   Assessment/Plan:  Kelsey Cole is a 35 y.o. G2P1001 at 59w0dhere for IOL for A1GDM.  #Labor: Given current 5cm dilation, starting pitocin 2x2; will likely AROM at next check  #Pain: Epidural upon request  #FWB: Cat I strip #ID: GBS negative #MOF: Breast #MOC:Desires PP BTL #Circ:  NA GMDA1: Diet controlled, BG check q4 in labor.  JBrooks Sailors MD  11/19/2020, 10:48 AM  Attestation of Supervision of Student:  I confirm that I have verified the information documented in the resident's note and that I have  also personally reperformed the history, physical exam and all medical decision making activities.  I have verified that all services and findings are accurately documented in this student's note; and I agree with management and plan as outlined in the documentation. I have also made any necessary editorial changes.  ARanda Ngo [facility] 11/19/2020 1:24 PM

## 2020-11-19 NOTE — Lactation Note (Addendum)
This note was copied from a baby's chart. Lactation Consultation Note  Patient Name: Kelsey Cole YJEHU'D Date: 11/19/2020 Reason for consult: L&D Initial assessment;Term Age:36 hours P2, term female infant. LC entered the room, mom was doing STS with infant cuing to breastfeed. LC ask mom to do breast stimulation and hand express prior to latching infant at the breast to help evert nipple shaft out more, mom has very flat nipples. Infant was given 1 ml of colostrum by spoon. Mom latch infant on her right breast using the football hold position, infant latched with depth and breastfeed for 10 minutes. Due to  mom, nipples being flat LC asked mom, if she feels pain and not a tug when infant is latched,  to break latch and re- latch infant on the breast  to avoid nipple stripes. Infant came off breast and mom re-latched infant on her left breast using the football hold position, when [facility] left the room, infant had been breastfeeding for 10 minutes. LC explained to mom to pre-pump breast prior to latching infant to help evert nipples out more and wear breast shells in bra during the day and MBU ( RN) will explain and demonstrate to mom how to use.  LC left hand pump  and breast shells in (room 507), with written instructions on while board  that MBU (RN ) will  explain to mom how to use.  Mom knows to breast feed infant according to primal cues: suckling, licking, tasting, smacking, rooting and hands in mouth. Mom knows to ask RN or LC on MBU if she needs  further assistance with latching infant at the breast. Mom's plan: 1- Breastfeed infant according to primary cues, do lots of STS with infant. 2- Mom will pre-pump breast with hand pump prior to latching infant at the breast. 3- Mom will wear breast shells during the day in bras and not at night or while sleeping.  Maternal Data Has patient been taught Hand Expression?: Yes Does the patient have breastfeeding experience prior to this  delivery?: Yes How long did the patient breastfeed?: Per mom, she only breastfeed her son for 1 month due latch difficulties and she used NS  Feeding Mother's Current Feeding Choice: Breast Milk  LATCH Score Latch: Grasps breast easily, tongue down, lips flanged, rhythmical sucking.  Audible Swallowing: A few with stimulation  Type of Nipple: Flat  Comfort (Breast/Nipple): Soft / non-tender  Hold (Positioning): Assistance needed to correctly position infant at breast and maintain latch.  LATCH Score: 7   Lactation Tools Discussed/Used Tools: Shells;Pump Breast pump type: Manual Reason for Pumping: Pre-pump breast prior to latching infant due to having flat nipples  Interventions Interventions: Breast feeding basics reviewed;Assisted with latch;Skin to skin;Hand express;Pre-pump if needed;Breast compression;Adjust position;Support pillows;Position options;Expressed milk;Shells;Hand pump  Discharge Pump: Manual  Consult Status Consult Status: Follow-up Date: 11/20/20 Follow-up type: In-patient    [name] 11/19/2020, 8:43 PM

## 2020-11-19 NOTE — Anesthesia Preprocedure Evaluation (Signed)
Anesthesia Evaluation  Patient identified by MRN, date of birth, ID band Patient awake    Reviewed: Allergy & Precautions, NPO status , Patient's Chart, lab work & pertinent test results  Airway Mallampati: II  TM Distance: >3 FB Neck ROM: Full    Dental no notable dental hx.    Pulmonary neg pulmonary ROS,    Pulmonary exam normal breath sounds clear to auscultation       Cardiovascular negative cardio ROS Normal cardiovascular exam Rhythm:Regular Rate:Normal     Neuro/Psych negative neurological ROS  negative psych ROS   GI/Hepatic negative GI ROS, Neg liver ROS,   Endo/Other  diabetes, Gestational  Renal/GU negative Renal ROS  negative genitourinary   Musculoskeletal negative musculoskeletal ROS (+)   Abdominal   Peds  Hematology negative hematology ROS (+)   Anesthesia Other Findings IOL for gDM  Reproductive/Obstetrics (+) Pregnancy                             Anesthesia Physical Anesthesia Plan  ASA: III  Anesthesia Plan: Epidural   Post-op Pain Management:    Induction:   PONV Risk Score and Plan: Treatment may vary due to age or medical condition  Airway Management Planned: Natural Airway  Additional Equipment:   Intra-op Plan:   Post-operative Plan:   Informed Consent: I have reviewed the patients History and Physical, chart, labs and discussed the procedure including the risks, benefits and alternatives for the proposed anesthesia with the patient or authorized representative who has indicated his/her understanding and acceptance.       Plan Discussed with: Anesthesiologist  Anesthesia Plan Comments: (Patient identified. Risks, benefits, options discussed with patient including but not limited to bleeding, infection, nerve damage, paralysis, failed block, incomplete pain control, headache, blood pressure changes, nausea, vomiting, reactions to medication,  itching, and post partum back pain. Confirmed with bedside nurse the patient's most recent platelet count. Confirmed with the patient that they are not taking any anticoagulation, have any bleeding history or any family history of bleeding disorders. Patient expressed understanding and wishes to proceed. All questions were answered. )        Anesthesia Quick Evaluation  

## 2020-11-19 NOTE — Anesthesia Procedure Notes (Signed)
Epidural Patient location during procedure: OB Start time: 11/19/2020 4:10 PM End time: 11/19/2020 4:20 PM  Staffing Anesthesiologist: Elmer Picker, MD Performed: anesthesiologist   Preanesthetic Checklist Completed: patient identified, IV checked, risks and benefits discussed, monitors and equipment checked, pre-op evaluation and timeout performed  Epidural Patient position: sitting Prep: DuraPrep and site prepped and draped Patient monitoring: continuous pulse ox, blood pressure, heart rate and cardiac monitor Approach: midline Location: L3-L4 Injection technique: LOR air  Needle:  Needle type: Tuohy  Needle gauge: 17 G Needle length: 9 cm Needle insertion depth: 4 cm Catheter type: closed end flexible Catheter size: 19 Gauge Catheter at skin depth: 10 cm Test dose: negative  Assessment Sensory level: T8 Events: blood not aspirated, injection not painful, no injection resistance, no paresthesia and negative IV test  Additional Notes Patient identified. Risks/Benefits/Options discussed with patient including but not limited to bleeding, infection, nerve damage, paralysis, failed block, incomplete pain control, headache, blood pressure changes, nausea, vomiting, reactions to medication both or allergic, itching and postpartum back pain. Confirmed with bedside nurse the patient's most recent platelet count. Confirmed with patient that they are not currently taking any anticoagulation, have any bleeding history or any family history of bleeding disorders. Patient expressed understanding and wished to proceed. All questions were answered. Sterile technique was used throughout the entire procedure. Please see nursing notes for vital signs. Test dose was given through epidural catheter and negative prior to continuing to dose epidural or start infusion. Warning signs of high block given to the patient including shortness of breath, tingling/numbness in hands, complete motor block,  or any concerning symptoms with instructions to call for help. Patient was given instructions on fall risk and not to get out of bed. All questions and concerns addressed with instructions to call with any issues or inadequate analgesia.  Reason for block:procedure for pain

## 2020-11-19 NOTE — Discharge Instructions (Signed)

## 2020-11-20 ENCOUNTER — Inpatient Hospital Stay (HOSPITAL_COMMUNITY): Payer: BC Managed Care – PPO | Admitting: Anesthesiology

## 2020-11-20 ENCOUNTER — Telehealth: Payer: Self-pay | Admitting: *Deleted

## 2020-11-20 ENCOUNTER — Encounter (HOSPITAL_COMMUNITY): Payer: Self-pay | Admitting: Family Medicine

## 2020-11-20 ENCOUNTER — Encounter (HOSPITAL_COMMUNITY)
Admission: AD | Disposition: A | Discharge status: Home / Self Care | Payer: Self-pay | Source: Home / Self Care | Attending: Obstetrics & Gynecology

## 2020-11-20 DIAGNOSIS — Z302 Encounter for sterilization: Secondary | ICD-10-CM

## 2020-11-20 DIAGNOSIS — Z9079 Acquired absence of other genital organ(s): Secondary | ICD-10-CM

## 2020-11-20 HISTORY — PX: TUBAL LIGATION: SHX77

## 2020-11-20 LAB — GLUCOSE, CAPILLARY
Glucose-Capillary: 124 mg/dL — ABNORMAL HIGH (ref 70–99)
Glucose-Capillary: 85 mg/dL (ref 70–99)

## 2020-11-20 SURGERY — LIGATION, FALLOPIAN TUBE, POSTPARTUM
Anesthesia: Epidural

## 2020-11-20 MED ORDER — FENTANYL CITRATE (PF) 100 MCG/2ML IJ SOLN
INTRAMUSCULAR | Status: DC | PRN
  Administered 2020-11-20: 100 ug via EPIDURAL
  Administered 2020-11-20: 50 ug via EPIDURAL

## 2020-11-20 MED ORDER — MIDAZOLAM HCL 2 MG/2ML IJ SOLN
INTRAMUSCULAR | Status: AC
  Filled 2020-11-20: qty 2

## 2020-11-20 MED ORDER — LACTATED RINGERS IV SOLN: INTRAVENOUS | Status: DC | PRN

## 2020-11-20 MED ORDER — SODIUM BICARBONATE 8.4 % IV SOLN
INTRAVENOUS | Status: DC | PRN
  Administered 2020-11-20: 3 mL via EPIDURAL
  Administered 2020-11-20: 5 mL via EPIDURAL
  Administered 2020-11-20: 7 mL via EPIDURAL

## 2020-11-20 MED ORDER — PHENYLEPHRINE 40 MCG/ML (10ML) SYRINGE FOR IV PUSH (FOR BLOOD PRESSURE SUPPORT)
PREFILLED_SYRINGE | INTRAVENOUS | Status: AC
  Filled 2020-11-20: qty 20

## 2020-11-20 MED ORDER — LACTATED RINGERS IV SOLN
INTRAVENOUS | Status: DC
  Administered 2020-11-20: 10 mL via INTRAVENOUS

## 2020-11-20 MED ORDER — AMISULPRIDE (ANTIEMETIC) 5 MG/2ML IV SOLN: 10.0000 mg | Freq: Once | INTRAVENOUS | Status: DC | PRN

## 2020-11-20 MED ORDER — METOCLOPRAMIDE HCL 10 MG PO TABS
10.0000 mg | ORAL_TABLET | Freq: Once | ORAL | Status: AC
  Administered 2020-11-20: 10 mg via ORAL
  Filled 2020-11-20: qty 1

## 2020-11-20 MED ORDER — DEXAMETHASONE SODIUM PHOSPHATE 4 MG/ML IJ SOLN
INTRAMUSCULAR | Status: AC
  Filled 2020-11-20: qty 1

## 2020-11-20 MED ORDER — PHENYLEPHRINE HCL (PRESSORS) 10 MG/ML IV SOLN
INTRAVENOUS | Status: DC | PRN
  Administered 2020-11-20: 80 ug via INTRAVENOUS
  Administered 2020-11-20: 40 ug via INTRAVENOUS
  Administered 2020-11-20: 80 ug via INTRAVENOUS
  Administered 2020-11-20: 120 ug via INTRAVENOUS
  Administered 2020-11-20: 40 ug via INTRAVENOUS
  Administered 2020-11-20: 120 ug via INTRAVENOUS

## 2020-11-20 MED ORDER — ONDANSETRON HCL 4 MG/2ML IJ SOLN
INTRAMUSCULAR | Status: AC
  Filled 2020-11-20: qty 2

## 2020-11-20 MED ORDER — FENTANYL CITRATE (PF) 100 MCG/2ML IJ SOLN
25.0000 ug | INTRAMUSCULAR | Status: DC | PRN
Start: 2020-11-20 — End: 2020-11-20

## 2020-11-20 MED ORDER — ONDANSETRON HCL 4 MG/2ML IJ SOLN
INTRAMUSCULAR | Status: DC | PRN
  Administered 2020-11-20: 4 mg via INTRAVENOUS

## 2020-11-20 MED ORDER — MIDAZOLAM HCL 5 MG/5ML IJ SOLN
INTRAMUSCULAR | Status: DC | PRN
  Administered 2020-11-20 (×2): 1 mg via INTRAVENOUS

## 2020-11-20 MED ORDER — FENTANYL CITRATE (PF) 100 MCG/2ML IJ SOLN
INTRAMUSCULAR | Status: AC
  Filled 2020-11-20: qty 2

## 2020-11-20 MED ORDER — SODIUM BICARBONATE 8.4 % IV SOLN
INTRAVENOUS | Status: AC
  Filled 2020-11-20: qty 50

## 2020-11-20 MED ORDER — FAMOTIDINE 20 MG PO TABS
40.0000 mg | ORAL_TABLET | Freq: Once | ORAL | Status: AC
  Administered 2020-11-20: 40 mg via ORAL
  Filled 2020-11-20: qty 2

## 2020-11-20 MED ORDER — DEXAMETHASONE SODIUM PHOSPHATE 4 MG/ML IJ SOLN
INTRAMUSCULAR | Status: DC | PRN
  Administered 2020-11-20: 4 mg via INTRAVENOUS

## 2020-11-20 MED ORDER — BUPIVACAINE HCL (PF) 0.25 % IJ SOLN
INTRAMUSCULAR | Status: AC
  Filled 2020-11-20: qty 30

## 2020-11-20 SURGICAL SUPPLY — 3 items
DRSG OPSITE POSTOP 3X4 (GAUZE/BANDAGES/DRESSINGS) ×2 IMPLANT
SUT MON AB 2-0 CT1 36 (SUTURE) ×4 IMPLANT
SUT PROLENE 1 CT (SUTURE) ×2 IMPLANT

## 2020-11-20 NOTE — Lactation Note (Signed)
This note was copied from a baby's chart. Lactation Consultation Note  Patient Name: Kelsey Cole [id number] Date: 11/20/2020 Reason for consult: Follow-up assessment;Term;Infant weight loss (-1% weight loss) Age:36 hours P2, term female infant. Infant had 3 voids and 6 stools since birth. Mom is currently breast and formula feeding infant her choice. LC entered room, infant had a  pacifier in mouth but was cuing to breastfeed.  LC discussed with parents to wait to offer infant the pacifier at 73 weeks of age  due to the  risk of infant not latching well at the breast and could possibly miss when infant is cuing to breastfeed.  Mom was willing to latch infant at the breast with assistance from [facility]. Mom applied 20 mm NS that was pre-filled with 0.5 mls of formula and mom latched infant on her left breast using the football hold position. Infant was supplemented with 12 mls of formula using a curve tip syringe at the breast. Infant was still breastfeeding when LC left the room, after 15 minutes. Mom was given comfort gels to wear in bra due to abrasion earlier from poor latches.  Mom feels infant is latch well when using the 20 mm NS, no current concerns about breastfeeding.  Mom knows to call RN or LC if she has further questions, concerns or needs further assistance with latching infant at the breast.     Maternal Data    Feeding Mother's Current Feeding Choice: Breast Milk and Formula Nipple Type: Slow - flow  LATCH Score Latch: Grasps breast easily, tongue down, lips flanged, rhythmical sucking.  Audible Swallowing: Spontaneous and intermittent  Type of Nipple: Flat  Comfort (Breast/Nipple): Filling, red/small blisters or bruises, mild/mod discomfort  Hold (Positioning): Assistance needed to correctly position infant at breast and maintain latch.  LATCH Score: 7   Lactation Tools Discussed/Used Tools: Shells;Pump;Comfort gels;Nipple Shields Nipple shield size: 20  (flat nipples) Breast pump type: Manual Pumping frequency: Mom will use hand pump pre-pump breast prior to applying 20 mmNS  Interventions Interventions: Assisted with latch;Skin to skin;Breast massage;Position options;Support pillows;Adjust position;Breast feeding basics reviewed;Breast compression;Hand express;Expressed milk;Education  Discharge    Consult Status Consult Status: Follow-up Date: 11/21/20 Follow-up type: In-patient    [name] 11/20/2020, 6:38 PM

## 2020-11-20 NOTE — Lactation Note (Addendum)
This note was copied from a baby's chart. Lactation Consultation Note  Patient Name: Kelsey Cole [id number] Date: 11/20/2020 Reason for consult: Follow-up assessment;Term Age:37 hours  P2 mother whose infant is now 76 hours old.  This is a term baby at 39+0 weeks.    Baby was swaddled and asleep in father's arms when I arrived.  Mother was dressed in surgical gown and awaiting transport to go to OR for a tubal ligation.  Briefly discussed breast feeding basics with mother.  Upon assessment, mother has flat nipples and bruises on areolas bilaterally due to previous poor latching.  Mother was given breast shells yesterday, however, they remained in the package unopened.  Educated mother on the usage of breast shells.  Suggested she begin wearing these after surgery to help evert nipples.  She also had a manual pump which I reviewed with her.  Asked her to pre-pump for a couple of minutes prior to latching.  Mother verbalized understanding.  Since mother is ready to go to surgery, I will have the next shift lactation consultant work with her after she returns.  Mother will require assistance with latching to be certain baby is latching correctly.  Mother appreciative.  Father will remain in room and care for baby during surgery.   Maternal Data Has patient been taught Hand Expression?: Yes  Feeding Mother's Current Feeding Choice: Breast Milk and Formula Nipple Type: Slow - flow  LATCH Score                    Lactation Tools Discussed/Used Tools: Shells;Pump Breast pump type: Manual  Interventions Interventions: Education  Discharge    Consult Status Consult Status: Follow-up Date: 11/21/20 Follow-up type: In-patient    Tiasha Helvie R Sirron Francesconi 11/20/2020, 1:13 PM

## 2020-11-20 NOTE — Progress Notes (Signed)
Patient desires permanent sterilization.  Other reversible forms of contraception were discussed with patient; she declines all other modalities. Risks of procedure discussed with patient including but not limited to: risk of regret, permanence of method, bleeding, infection, injury to surrounding organs and need for additional procedures.  Failure risk of about 1% with increased risk of ectopic gestation if pregnancy occurs was also discussed with patient.  Also discussed possibility of post-tubal pain syndrome. The patient concurred with the proposed plan, giving informed written consent for the procedures.  Patient has been NPO since last night she will remain NPO for procedure. Anesthesia and OR aware.

## 2020-11-20 NOTE — Telephone Encounter (Signed)
Left patient a message that a 2 hour Postpartum GTT is needed with Postpartum appointment and if she would like to complete that at appointment on 12/18/2020.

## 2020-11-20 NOTE — Op Note (Signed)
Kelsey Cole 11/20/2020  PREOPERATIVE DIAGNOSES: Multiparity, undesired fertility  POSTOPERATIVE DIAGNOSES: Multiparity, undesired fertility  PROCEDURE:  Postpartum Bilateral Salpingectomy   SURGEON: Dr.  Merian Capron FELLOW: Dr. Casper Harrison   ANESTHESIA:  Epidural   COMPLICATIONS:  None immediate.  ESTIMATED BLOOD LOSS: 10 ml.  INDICATIONS:  35 y.o. [id number] with undesired fertility, status post vaginal delivery, desires permanent sterilization.  Other reversible forms of contraception were discussed with patient; she declines all other modalities. Risks of procedure discussed with patient including but not limited to: risk of regret, permanence of method, bleeding, infection, injury to surrounding organs and need for additional procedures.  Discussed failure risk of 1% with increased risk of ectopic gestation if pregnancy occurs.  Also discussed possibility of post-tubal syndrome with increased pelvic pain or menstrual irregularities.  Patient verbalized understanding of these risks and wants to proceed with sterilization.  Written informed consent obtained.     FINDINGS:  Normal uterus, tubes, and ovaries. Excised fallopian tubes were sent to pathology.   PROCEDURE DETAILS: The patient was taken to the operating room where her epidural anesthesia was dosed up to surgical level and found to be adequate.  She was then placed in the dorsal supine position and prepped and draped in sterile fashion.   After an adequate timeout was performed, attention was turned to the patient's abdomen local analgesia was administered.  A small transverse skin incision was made under the umbilical fold. The incision was taken down to the layer of fascia using the scalpel, and fascia was incised, and extended bilaterally using Mayo scissors. The peritoneum was entered in a sharp fashion.  Attention was then turned to the patient's uterus, and left fallopian tube was then identified, and the Babcock clamp  was then used to grasp the tube. Kelly forceps were placed on the distal portion of the mesosalpinx underneath about 80-90% of the tube.  This pedicle was double suture ligated with 2-0 Monocryl, and this large portion of the tube including the fimbriated end was excised.  The right fallopian tube was then identified, doubly ligated, excised in a similar fashion allowing for bilateral salpingectomy.   Good hemostasis was noted overall.  The instruments were then removed from the patient's abdomen and the fascial incision was repaired with 0 Vicryl. One horizontal mattress suture placed in subcutaneous layer with 2-0 plain gut. The skin was closed with a 4-0 Vicryl subcuticular stitch. The patient tolerated the procedure well.  Instrument, sponge, and needle counts were correct times three.  The patient was then taken to the recovery room awake and in stable condition.   Casper Harrison, MD [facility] Family Medicine Fellow, [facility] for [facility], [facility]

## 2020-11-20 NOTE — Telephone Encounter (Signed)
-----   Message from Sheila Oats, MD sent at 11/19/2020  7:36 PM EDT ----- Regarding: PP Visit  Please schedule this patient for a In person postpartum visit in 6 weeks with the following provider: Any provider. Additional Postpartum F/U:2 hour GTT at postpartum appt Low risk pregnancy complicated by: A1GDM Delivery mode:  Vaginal, Spontaneous  Anticipated Birth Control:  BTL done PP

## 2020-11-20 NOTE — Anesthesia Preprocedure Evaluation (Addendum)
Anesthesia Evaluation  Patient identified by MRN, date of birth, ID band Patient awake    Reviewed: Allergy & Precautions, NPO status , Patient's Chart, lab work & pertinent test results  History of Anesthesia Complications Negative for: history of anesthetic complications  Airway Mallampati: II  TM Distance: >3 FB Neck ROM: Full    Dental no notable dental hx. (+) Dental Advisory Given   Pulmonary neg pulmonary ROS,    Pulmonary exam normal breath sounds clear to auscultation       Cardiovascular negative cardio ROS Normal cardiovascular exam Rhythm:Regular Rate:Normal     Neuro/Psych negative neurological ROS  negative psych ROS   GI/Hepatic negative GI ROS, Neg liver ROS,   Endo/Other  diabetes, Gestational  Renal/GU negative Renal ROS  negative genitourinary   Musculoskeletal negative musculoskeletal ROS (+)   Abdominal   Peds  Hematology negative hematology ROS (+)   Anesthesia Other Findings IOL for gDM  Reproductive/Obstetrics                            Anesthesia Physical  Anesthesia Plan  ASA: III  Anesthesia Plan: Epidural   Post-op Pain Management:    Induction:   PONV Risk Score and Plan: 2 and Treatment may vary due to age or medical condition, Ondansetron and Propofol infusion  Airway Management Planned: Natural Airway  Additional Equipment:   Intra-op Plan:   Post-operative Plan:   Informed Consent: I have reviewed the patients History and Physical, chart, labs and discussed the procedure including the risks, benefits and alternatives for the proposed anesthesia with the patient or authorized representative who has indicated his/her understanding and acceptance.     Dental advisory given  Plan Discussed with: Anesthesiologist and CRNA  Anesthesia Plan Comments:        Anesthesia Quick Evaluation

## 2020-11-20 NOTE — Progress Notes (Addendum)
Post Partum Day 1 Subjective: no complaints, voiding and + flatus  Objective: Blood pressure 99/64, pulse 79, temperature 98.6 F (37 C), temperature source Oral, resp. rate 18, height 5\' 1"  (1.549 m), weight 76.2 kg, last menstrual period 02/20/2020, SpO2 100 %, unknown if currently breastfeeding.  Physical Exam:  General: alert and cooperative Lochia: appropriate Uterine Fundus: firm Incision: healing well DVT Evaluation: No evidence of DVT seen on physical exam.  Recent Labs    11/19/20 0926  HGB 12.5  HCT 35.8*    Assessment/Plan: Contraception: BTL today at 1330   LOS: 1 day   11/21/20 Muus 11/20/2020, 8:04 AM

## 2020-11-20 NOTE — Anesthesia Postprocedure Evaluation (Signed)
Anesthesia Post Note  Patient: Kelsey Cole  Procedure(s) Performed: POST PARTUM TUBAL LIGATION (N/A )     Patient location during evaluation: PACU Anesthesia Type: Epidural Level of consciousness: awake and alert Pain management: pain level controlled Vital Signs Assessment: post-procedure vital signs reviewed and stable Respiratory status: spontaneous breathing and respiratory function stable Cardiovascular status: blood pressure returned to baseline and stable Postop Assessment: spinal receding Anesthetic complications: no   No complications documented.  Last Vitals:  Vitals:   11/20/20 1610 11/20/20 1620  BP:    Pulse: 80 75  Resp: 16 14  Temp:  (!) 36.3 C  SpO2: 100% 100%    Last Pain:  Vitals:   11/20/20 1620  TempSrc: Tympanic  PainSc:    Pain Goal:    LLE Motor Response: Purposeful movement (11/20/20 1620)   RLE Motor Response: Purposeful movement (11/20/20 1620)       Epidural/Spinal Function Cutaneous sensation: Pins and Needles (11/20/20 1600), Patient able to flex knees: Yes (11/20/20 1600), Patient able to lift hips off bed: Yes (11/20/20 1600), Back pain beyond tenderness at insertion site: No (11/20/20 1600), Progressively worsening motor and/or sensory loss: No (11/20/20 1600), Bowel and/or bladder incontinence post epidural: No (11/20/20 1600)  Kalinda Romaniello DANIEL

## 2020-11-20 NOTE — Transfer of Care (Signed)
Immediate Anesthesia Transfer of Care Note  Patient: Kelsey Cole  Procedure(s) Performed: POST PARTUM TUBAL LIGATION (N/A )  Patient Location: PACU  Anesthesia Type:Epidural  Level of Consciousness: awake, alert  and oriented  Airway & Oxygen Therapy: Patient Spontanous Breathing  Post-op Assessment: Report given to RN and Post -op Vital signs reviewed and stable  Post vital signs: Reviewed and stable  Last Vitals:  Vitals Value Taken Time  BP 104/69 11/20/20 1515  Temp    Pulse 90 11/20/20 1518  Resp 13 11/20/20 1518  SpO2 100 % 11/20/20 1518  Vitals shown include unvalidated device data.  Last Pain:  Vitals:   11/20/20 0948  TempSrc: Oral  PainSc: 0-No pain         Complications: No complications documented.

## 2020-11-20 NOTE — Anesthesia Postprocedure Evaluation (Signed)
Anesthesia Post Note  Patient: Kelsey Cole  Procedure(s) Performed: AN AD HOC LABOR EPIDURAL     Patient location during evaluation: Mother Baby Anesthesia Type: Epidural Level of consciousness: awake Pain management: satisfactory to patient Vital Signs Assessment: post-procedure vital signs reviewed and stable Respiratory status: spontaneous breathing Cardiovascular status: stable Anesthetic complications: no   No complications documented.  Last Vitals:  Vitals:   11/20/20 0531 11/20/20 0948  BP: 99/64 105/70  Pulse: 79 91  Resp: 18 18  Temp: 37 C 37.1 C  SpO2: 100%     Last Pain:  Vitals:   11/20/20 0948  TempSrc: Oral  PainSc:    Pain Goal:                   Cephus Shelling

## 2020-11-21 LAB — GLUCOSE, CAPILLARY: Glucose-Capillary: 75 mg/dL (ref 70–99)

## 2020-11-21 NOTE — Lactation Note (Signed)
This note was copied from a baby's chart. Lactation Consultation Note  Patient Name: Kelsey Cole [id number] Date: 11/21/2020 Reason for consult: Follow-up assessment Age:36 hours   P2 mother whose infant is now 54 hours old.  This is a term baby at 39+0 weeks.  Mother has been formula feeding and desires to work on breast feeding after she gets home.  Baby was asleep when I arrived.  Mother had no questions/concers related to breast feeding.  Educated mother on the importance of putting baby to the breast to stimulate the breasts for milk production and to help baby learn to latch.  Encouraged to continue hand expression to aid in supply. Offered to return for latch assistance if desired prior to discharge.  Mother plans to initiate her own DEBP after discharge today.  Mother has not used the breast shells given yesterday and has not used the manual pump much since yesterday's instruction.  She has our OP phone number for any further questions/concerns after discharge.  Father present.  RN updated.   Maternal Data    Feeding Nipple Type: Slow - flow  LATCH Score                    Lactation Tools Discussed/Used    Interventions Interventions: Education  Discharge Discharge Education: Engorgement and breast care  Consult Status Consult Status: Complete Date: 11/21/20 Follow-up type: Call as needed    Kelsey Cole 11/21/2020, 8:56 AM

## 2020-11-22 DIAGNOSIS — L530 Toxic erythema: Secondary | ICD-10-CM | POA: Diagnosis not present

## 2020-11-22 DIAGNOSIS — Z00110 Health examination for newborn under 8 days old: Secondary | ICD-10-CM | POA: Diagnosis not present

## 2020-11-22 LAB — SURGICAL PATHOLOGY

## 2020-11-27 ENCOUNTER — Telehealth: Payer: Self-pay | Admitting: *Deleted

## 2020-11-27 NOTE — Telephone Encounter (Signed)
Left patient a message that a 2 hour Postpartum GTT is needed with Postpartum appointment and if she would like to complete that at appointment on 12/18/2020. 

## 2020-12-13 NOTE — Progress Notes (Signed)
    Post Partum Visit Note  Kelsey Cole is a 35 y.o. [id number] female who presents for a postpartum visit. She is 4 weeks postpartum following a normal spontaneous vaginal delivery.  I have fully reviewed the prenatal and intrapartum course. The delivery was at 39 gestational weeks.  Anesthesia: epidural. Postpartum course has been unremarkable. Baby is doing wellunremarkable. Baby is feeding by both breast and bottle - Gerber Gentle. Bleeding brown. Bowel function is normal. Bladder function is normal. Patient is not sexually active. Contraception method is tubal ligation. Postpartum depression screening: negative.   The pregnancy intention screening data noted above was reviewed. Potential methods of contraception were discussed. The patient elected to proceed with Female Sterilization.     Health Maintenance Due  Topic Date Due  . COVID-19 Vaccine (1) Never done  . URINE MICROALBUMIN  Never done     Medical record reviewed  Review of Systems Pertinent items noted in HPI and remainder of comprehensive ROS otherwise negative.  Objective:  There were no vitals taken for this visit.   General:  alert   Breasts:  not indicated  Lungs: clear to auscultation bilaterally  Heart:  regular rate and rhythm, S1, S2 normal, no murmur, click, rub or gallop  Abdomen: soft, non-tender; bowel sounds normal; no masses,  no organomegaly   Wound well approximated incision  GU exam:  not indicated       Assessment:    There are no diagnoses linked to this encounter.  Nl postpartum exam.  H/O GDM  Plan:   Essential components of care per ACOG recommendations:  1.  Mood and well being: Patient with negative depression screening today. Reviewed local resources for support.  - Patient does not use tobacco.  - hx of drug use? No    2. Infant care and feeding:  -Patient currently breastmilk feeding? Yes If breastmilk feeding discussed return to work and pumping. If needed, patient was  provided letter for work to allow for every 2-3 hr pumping breaks, and to be granted a private location to express breastmilk and refrigerated area to store breastmilk. Reviewed importance of draining breast regularly to support lactation. -Social determinants of health (SDOH) reviewed in EPIC. No concerns  3. Sexuality, contraception and birth spacing - Patient does not want a pregnancy in the next year.  Desired family size is completed. S/P Bialteral salpingectomy   4. Sleep and fatigue -Encouraged family/partner/community support of 4 hrs of uninterrupted sleep to help with mood and fatigue  5. Physical Recovery  - Discussed patients delivery and complications - Patient had a Vaginal, no problems at delivery. Perineal healing reviewed. Patient expressed understanding - Patient has urinary incontinence? No - Patient is safe to resume physical and sexual activity  6.  Health Maintenance - HM due items addressed Yes - Last pap smear done 3/20 and was normal with negative HPV.   7. Glucola ordered 12/27/2020   Nettie Elm, MD [facility], [facility]

## 2020-12-18 ENCOUNTER — Encounter: Payer: Self-pay | Admitting: Obstetrics and Gynecology

## 2020-12-18 ENCOUNTER — Other Ambulatory Visit: Payer: Self-pay

## 2020-12-18 ENCOUNTER — Ambulatory Visit (INDEPENDENT_AMBULATORY_CARE_PROVIDER_SITE_OTHER): Payer: BC Managed Care – PPO | Admitting: Obstetrics and Gynecology

## 2020-12-18 VITALS — BP 116/73 | HR 86 | Resp 16 | Ht 63.0 in | Wt 153.0 lb

## 2020-12-18 DIAGNOSIS — Z9079 Acquired absence of other genital organ(s): Secondary | ICD-10-CM

## 2020-12-18 DIAGNOSIS — O24419 Gestational diabetes mellitus in pregnancy, unspecified control: Secondary | ICD-10-CM

## 2020-12-18 NOTE — Patient Instructions (Signed)
Health Maintenance, Female Adopting a healthy lifestyle and getting preventive care are important in promoting health and wellness. Ask your health care provider about:  The right schedule for you to have regular tests and exams.  Things you can do on your own to prevent diseases and keep yourself healthy. What should I know about diet, weight, and exercise? Eat a healthy diet  Eat a diet that includes plenty of vegetables, fruits, low-fat dairy products, and lean protein.  Do not eat a lot of foods that are high in solid fats, added sugars, or sodium.   Maintain a healthy weight Body mass index (BMI) is used to identify weight problems. It estimates body fat based on height and weight. Your health care provider can help determine your BMI and help you achieve or maintain a healthy weight. Get regular exercise Get regular exercise. This is one of the most important things you can do for your health. Most adults should:  Exercise for at least 150 minutes each week. The exercise should increase your heart rate and make you sweat (moderate-intensity exercise).  Do strengthening exercises at least twice a week. This is in addition to the moderate-intensity exercise.  Spend less time sitting. Even light physical activity can be beneficial. Watch cholesterol and blood lipids Have your blood tested for lipids and cholesterol at 36 years of age, then have this test every 5 years. Have your cholesterol levels checked more often if:  Your lipid or cholesterol levels are high.  You are older than 36 years of age.  You are at high risk for heart disease. What should I know about cancer screening? Depending on your health history and family history, you may need to have cancer screening at various ages. This may include screening for:  Breast cancer.  Cervical cancer.  Colorectal cancer.  Skin cancer.  Lung cancer. What should I know about heart disease, diabetes, and high blood  pressure? Blood pressure and heart disease  High blood pressure causes heart disease and increases the risk of stroke. This is more likely to develop in people who have high blood pressure readings, are of African descent, or are overweight.  Have your blood pressure checked: ? Every 3-5 years if you are 18-39 years of age. ? Every year if you are 40 years old or older. Diabetes Have regular diabetes screenings. This checks your fasting blood sugar level. Have the screening done:  Once every three years after age 40 if you are at a normal weight and have a low risk for diabetes.  More often and at a younger age if you are overweight or have a high risk for diabetes. What should I know about preventing infection? Hepatitis B If you have a higher risk for hepatitis B, you should be screened for this virus. Talk with your health care provider to find out if you are at risk for hepatitis B infection. Hepatitis C Testing is recommended for:  Everyone born from 1945 through 1965.  Anyone with known risk factors for hepatitis C. Sexually transmitted infections (STIs)  Get screened for STIs, including gonorrhea and chlamydia, if: ? You are sexually active and are younger than 36 years of age. ? You are older than 36 years of age and your health care provider tells you that you are at risk for this type of infection. ? Your sexual activity has changed since you were last screened, and you are at increased risk for chlamydia or gonorrhea. Ask your health care provider   if you are at risk.  Ask your health care provider about whether you are at high risk for HIV. Your health care provider may recommend a prescription medicine to help prevent HIV infection. If you choose to take medicine to prevent HIV, you should first get tested for HIV. You should then be tested every 3 months for as long as you are taking the medicine. Pregnancy  If you are about to stop having your period (premenopausal) and  you may become pregnant, seek counseling before you get pregnant.  Take 400 to 800 micrograms (mcg) of folic acid every day if you become pregnant.  Ask for birth control (contraception) if you want to prevent pregnancy. Osteoporosis and menopause Osteoporosis is a disease in which the bones lose minerals and strength with aging. This can result in bone fractures. If you are 65 years old or older, or if you are at risk for osteoporosis and fractures, ask your health care provider if you should:  Be screened for bone loss.  Take a calcium or vitamin D supplement to lower your risk of fractures.  Be given hormone replacement therapy (HRT) to treat symptoms of menopause. Follow these instructions at home: Lifestyle  Do not use any products that contain nicotine or tobacco, such as cigarettes, e-cigarettes, and chewing tobacco. If you need help quitting, ask your health care provider.  Do not use street drugs.  Do not share needles.  Ask your health care provider for help if you need support or information about quitting drugs. Alcohol use  Do not drink alcohol if: ? Your health care provider tells you not to drink. ? You are pregnant, may be pregnant, or are planning to become pregnant.  If you drink alcohol: ? Limit how much you use to 0-1 drink a day. ? Limit intake if you are breastfeeding.  Be aware of how much alcohol is in your drink. In the [place] one drink equals one 12 oz bottle of beer (355 mL), one 5 oz glass of wine (148 mL), or one 1 oz glass of hard liquor (44 mL). General instructions  Schedule regular health, dental, and eye exams.  Stay current with your vaccines.  Tell your health care provider if: ? You often feel depressed. ? You have ever been abused or do not feel safe at home. Summary  Adopting a healthy lifestyle and getting preventive care are important in promoting health and wellness.  Follow your health care provider's instructions about healthy  diet, exercising, and getting tested or screened for diseases.  Follow your health care provider's instructions on monitoring your cholesterol and blood pressure. This information is not intended to replace advice given to you by your health care provider. Make sure you discuss any questions you have with your health care provider. Document Revised: 08/12/2018 Document Reviewed: 08/12/2018 Elsevier Patient Education  2021 Elsevier Inc.  

## 2020-12-27 DIAGNOSIS — O24419 Gestational diabetes mellitus in pregnancy, unspecified control: Secondary | ICD-10-CM | POA: Diagnosis not present

## 2020-12-28 LAB — 2HR GTT W 1 HR, CARPENTER, 75 G
Glucose, 1 Hr, Gest: 138 mg/dL (ref 65–179)
Glucose, 2 Hr, Gest: 152 mg/dL (ref 65–152)
Glucose, Fasting, Gest: 82 mg/dL (ref 65–91)

## 2022-07-30 ENCOUNTER — Ambulatory Visit (INDEPENDENT_AMBULATORY_CARE_PROVIDER_SITE_OTHER): Payer: BC Managed Care – PPO | Admitting: Family Medicine

## 2022-07-30 ENCOUNTER — Encounter: Payer: Self-pay | Admitting: Family Medicine

## 2022-07-30 VITALS — BP 121/74 | HR 91 | Temp 98.4°F | Ht 64.0 in | Wt 159.1 lb

## 2022-07-30 DIAGNOSIS — Z13220 Encounter for screening for lipoid disorders: Secondary | ICD-10-CM | POA: Diagnosis not present

## 2022-07-30 DIAGNOSIS — Z0000 Encounter for general adult medical examination without abnormal findings: Secondary | ICD-10-CM | POA: Diagnosis not present

## 2022-07-30 DIAGNOSIS — R7302 Impaired glucose tolerance (oral): Secondary | ICD-10-CM

## 2022-07-30 DIAGNOSIS — Z136 Encounter for screening for cardiovascular disorders: Secondary | ICD-10-CM

## 2022-07-30 DIAGNOSIS — Z7689 Persons encountering health services in other specified circumstances: Secondary | ICD-10-CM

## 2022-07-30 NOTE — Progress Notes (Signed)
New Patient Office Visit  Subjective    Patient ID: Kelsey Cole, female    DOB: Jul 23, 1986  Age: 36 y.o. MRN: [id number]  CC:  Chief Complaint  Patient presents with   New Patient (Initial Visit)    Patient requesting a yearly physical and form completed - required for patient insurance.     HPI Kelsey Cole presents to establish care and annual exam.  She has 1 88 y.o son and daughter 87 month old. Not working. Lives in [place] with husband and children. From [place]. Parents and family still lives there.  Taking multivitamin and mucinex right now prn for cold symptoms.  Pt has hx of gestational diabetes.  Pt has strong family hx of breast cancer in paternal aunts, 1 out of 2 has passed and 1 cousin in her 34s has passed from breast cancer.  Pt UTD with pap smear, next one due Feb 2025. Hasn't started with mammograms yet. Would like to defer flu vaccine since she has a cold.  Outpatient Encounter Medications as of 07/30/2022  Medication Sig   Multiple Vitamin (MULTIVITAMIN) tablet Take 1 tablet by mouth daily.   [DISCONTINUED] blood glucose meter kit and supplies KIT Dispense based on patient and insurance preference. Use up to four times daily as directed. (FOR ICD-9 250.00, 250.01).   [DISCONTINUED] Prenatal Vit-Fe Fumarate-FA (PRENATAL MULTIVITAMIN) TABS tablet Take 1 tablet by mouth daily at 12 noon.   No facility-administered encounter medications on file as of 07/30/2022.    Past Medical History:  Diagnosis Date   Gestational diabetes    History of positive PCR for herpes simplex virus type 1 (HSV-1) DNA     Past Surgical History:  Procedure Laterality Date   TUBAL LIGATION N/A 11/20/2020   Procedure: POST PARTUM TUBAL LIGATION;  Surgeon: Clarnce Flock, MD;  Location: [facility] LD ORS;  Service: Gynecology;  Laterality: N/A;    Family History  Problem Relation Age of Onset   Hypertension Mother    Diabetes Father    Hypertension Father     Hypertension Paternal Uncle    Breast cancer Paternal Aunt        1 out of 2 aunts passed breast cancer   Breast cancer Cousin 74 - 61    Social History   Socioeconomic History   Marital status: Married    Spouse name: Dealer   Number of children: 1   Years of education: college   Highest education level: Not on file  Occupational History   Occupation: stay at home mom  Tobacco Use   Smoking status: Never   Smokeless tobacco: Never  Vaping Use   Vaping Use: Never used  Substance and Sexual Activity   Alcohol use: Not Currently    Alcohol/week: 0.0 standard drinks of alcohol   Drug use: Never   Sexual activity: Yes    Partners: Male    Birth control/protection: Surgical  Other Topics Concern   Not on file  Social History Narrative   From [place]. Came to the [place] in 2011.   Lives with her husband and son/daughter   Social Determinants of Health   Financial Resource Strain: Not on file  Food Insecurity: Not on file  Transportation Needs: Not on file  Physical Activity: Not on file  Stress: Not on file  Social Connections: Not on file  Intimate Partner Violence: Not on file    Review of Systems  All other systems reviewed and are negative.  Objective    BP 121/74   Pulse 91   Temp 98.4 F (36.9 C)   Ht _0  (1.626 m)   Wt 159 lb 2 oz (72.2 kg)   SpO2 99%   BMI 27.31 kg/m   Physical Exam Vitals and nursing note reviewed.  Constitutional:      Appearance: Normal appearance. She is normal weight.  HENT:     Head: Normocephalic and atraumatic.     Right Ear: Tympanic membrane, ear canal and external ear normal.     Left Ear: Tympanic membrane, ear canal and external ear normal.     Nose: Nose normal.     Mouth/Throat:     Mouth: Mucous membranes are moist.     Pharynx: Oropharynx is clear.  Eyes:     Conjunctiva/sclera: Conjunctivae normal.     Pupils: Pupils are equal, round, and reactive to light.  Cardiovascular:     Rate and Rhythm:  Normal rate and regular rhythm.     Pulses: Normal pulses.     Heart sounds: Normal heart sounds.  Pulmonary:     Effort: Pulmonary effort is normal.     Breath sounds: Normal breath sounds.  Abdominal:     General: Abdomen is flat. Bowel sounds are normal.     Palpations: Abdomen is soft.  Musculoskeletal:     Cervical back: Normal range of motion.  Skin:    General: Skin is warm.     Capillary Refill: Capillary refill takes less than 2 seconds.  Neurological:     General: No focal deficit present.     Mental Status: She is alert and oriented to person, place, and time. Mental status is at baseline.  Psychiatric:        Mood and Affect: Mood normal.        Behavior: Behavior normal.        Thought Content: Thought content normal.        Judgment: Judgment normal.      Assessment & Plan:   Problem List Items Addressed This Visit   None Visit Diagnoses     Annual physical exam    -  Primary   Encounter to establish care with new doctor       Encounter for lipid screening for cardiovascular disease       Relevant Orders   Lipid panel   Glucose intolerance (impaired glucose tolerance)       Relevant Orders   CBC with Differential/Platelet   Comprehensive metabolic panel   Hemoglobin A1c      Annual exam today with screening labs No issues or concerns See in 1 year sooner prn .  Return in about 1 year (around 07/31/2023) for Annual Physical.   Leeanne Rio, MD

## 2022-07-31 LAB — CBC WITH DIFFERENTIAL/PLATELET
Basophils Absolute: 0 x10E3/uL (ref 0.0–0.2)
Basos: 0 %
EOS (ABSOLUTE): 0.2 x10E3/uL (ref 0.0–0.4)
Eos: 2 %
Hematocrit: 40.9 % (ref 34.0–46.6)
Hemoglobin: 13.6 g/dL (ref 11.1–15.9)
Immature Grans (Abs): 0 x10E3/uL (ref 0.0–0.1)
Immature Granulocytes: 0 %
Lymphocytes Absolute: 3.4 x10E3/uL — ABNORMAL HIGH (ref 0.7–3.1)
Lymphs: 37 %
MCH: 29.8 pg (ref 26.6–33.0)
MCHC: 33.3 g/dL (ref 31.5–35.7)
MCV: 90 fL (ref 79–97)
Monocytes Absolute: 0.5 x10E3/uL (ref 0.1–0.9)
Monocytes: 5 %
Neutrophils Absolute: 5 x10E3/uL (ref 1.4–7.0)
Neutrophils: 56 %
Platelets: 276 x10E3/uL (ref 150–450)
RBC: 4.57 x10E6/uL (ref 3.77–5.28)
RDW: 12.2 % (ref 11.7–15.4)
WBC: 9.1 x10E3/uL (ref 3.4–10.8)

## 2022-07-31 LAB — COMPREHENSIVE METABOLIC PANEL WITH GFR
ALT: 7 IU/L (ref 0–32)
AST: 16 IU/L (ref 0–40)
Albumin/Globulin Ratio: 1.6 (ref 1.2–2.2)
Albumin: 4.5 g/dL (ref 3.9–4.9)
Alkaline Phosphatase: 111 IU/L (ref 44–121)
BUN/Creatinine Ratio: 18 (ref 9–23)
BUN: 13 mg/dL (ref 6–20)
Bilirubin Total: 0.3 mg/dL (ref 0.0–1.2)
CO2: 22 mmol/L (ref 20–29)
Calcium: 9.1 mg/dL (ref 8.7–10.2)
Chloride: 103 mmol/L (ref 96–106)
Creatinine, Ser: 0.74 mg/dL (ref 0.57–1.00)
Globulin, Total: 2.8 g/dL (ref 1.5–4.5)
Glucose: 89 mg/dL (ref 70–99)
Potassium: 4.1 mmol/L (ref 3.5–5.2)
Sodium: 140 mmol/L (ref 134–144)
Total Protein: 7.3 g/dL (ref 6.0–8.5)
eGFR: 107 mL/min/1.73

## 2022-07-31 LAB — LIPID PANEL
Chol/HDL Ratio: 4.6 ratio — ABNORMAL HIGH (ref 0.0–4.4)
Cholesterol, Total: 187 mg/dL (ref 100–199)
HDL: 41 mg/dL (ref >39)
LDL Chol Calc (NIH): 89 mg/dL (ref 0–99)
Triglycerides: 348 mg/dL — ABNORMAL HIGH (ref 0–149)
VLDL Cholesterol Cal: 57 mg/dL — ABNORMAL HIGH (ref 5–40)

## 2022-07-31 LAB — HEMOGLOBIN A1C
Est. average glucose Bld gHb Est-mCnc: 103 mg/dL
Hgb A1c MFr Bld: 5.2 % (ref 4.8–5.6)

## 2022-08-07 ENCOUNTER — Encounter: Payer: Self-pay | Admitting: Family Medicine

## 2022-09-11 ENCOUNTER — Ambulatory Visit: Payer: BC Managed Care – PPO | Admitting: Medical-Surgical

## 2022-09-21 IMAGING — US US MFM OB DETAIL+14 WK
1 series · 13 of 28 positions shown · non-contrast
Comparison: none

[Series 1: us mfm ob detail+14 wk · 13 of 115 slices shown]
[im 5/115]
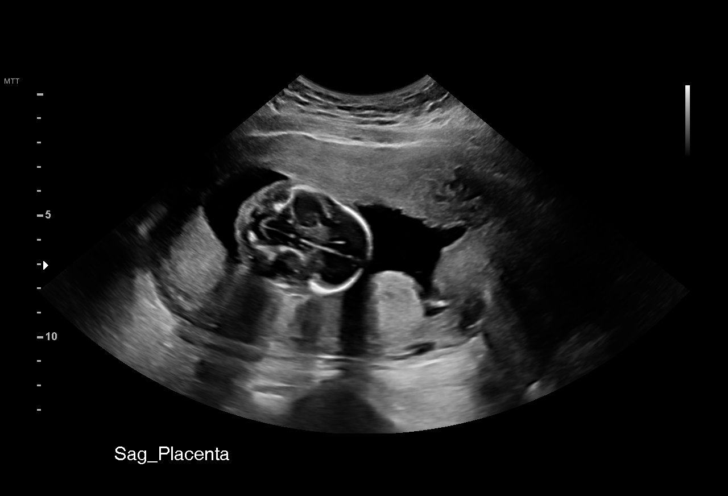
[im 13/115]
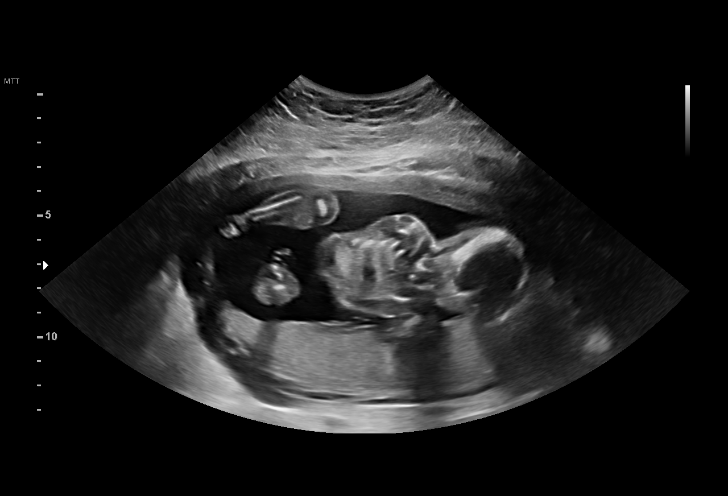
[im 22/115]
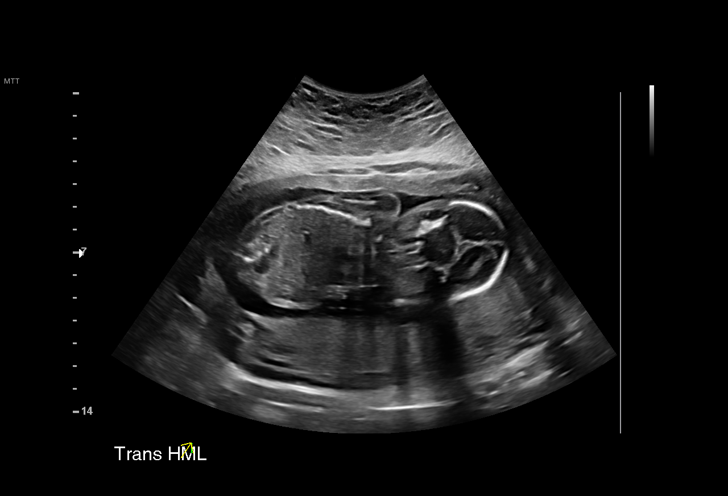
[im 30/115]
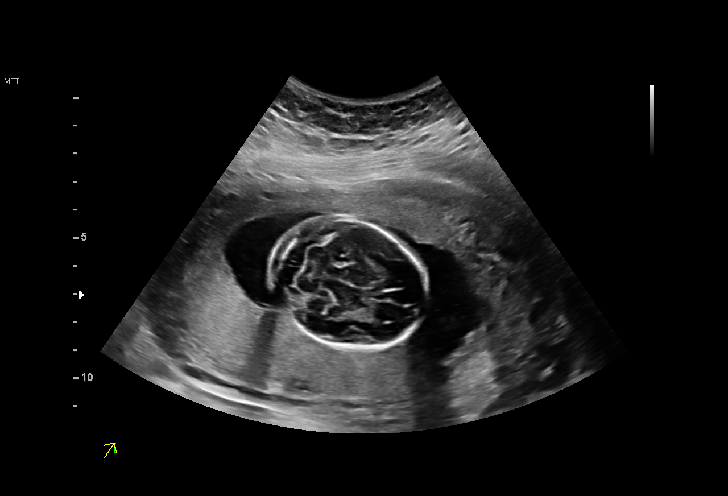
[im 39/115]
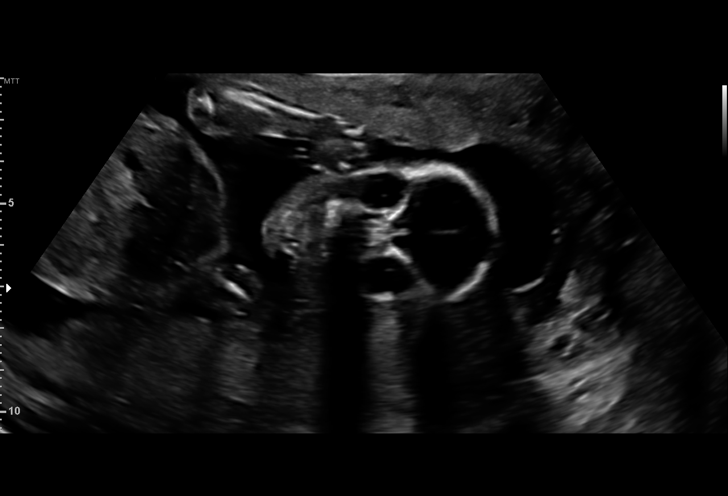
[im 47/115]
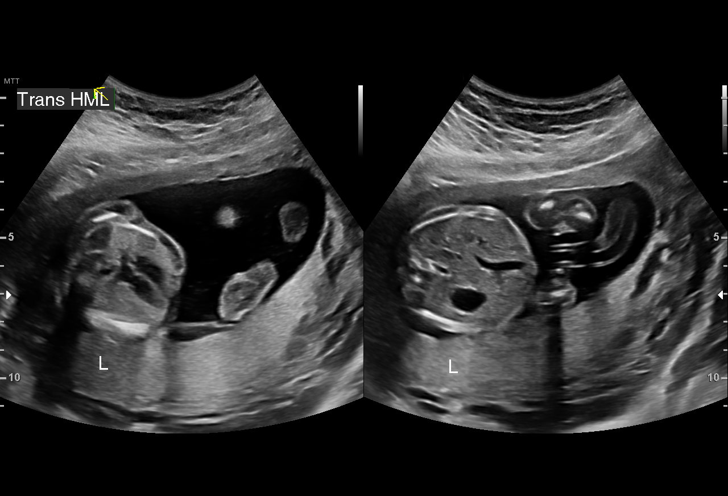
[im 60/115]
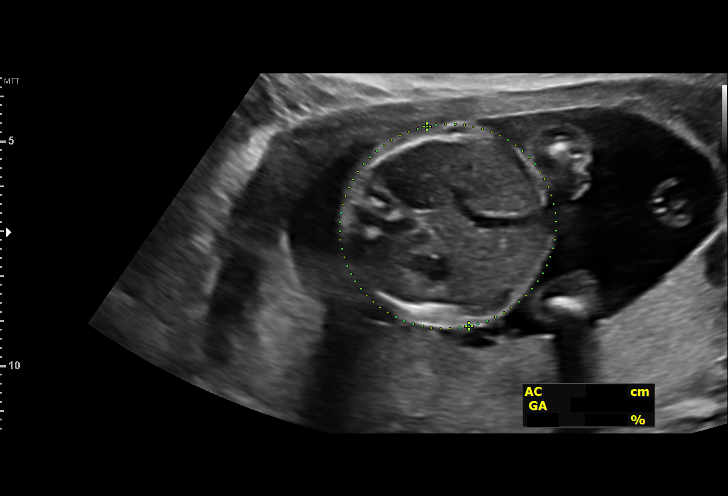
[im 68/115]
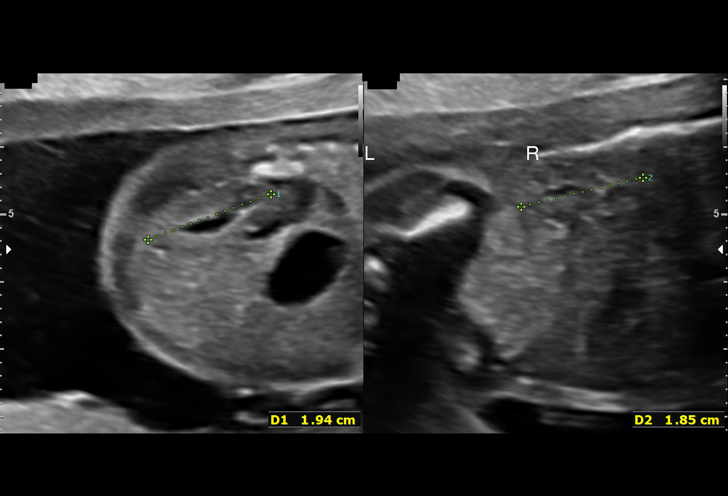
[im 77/115]
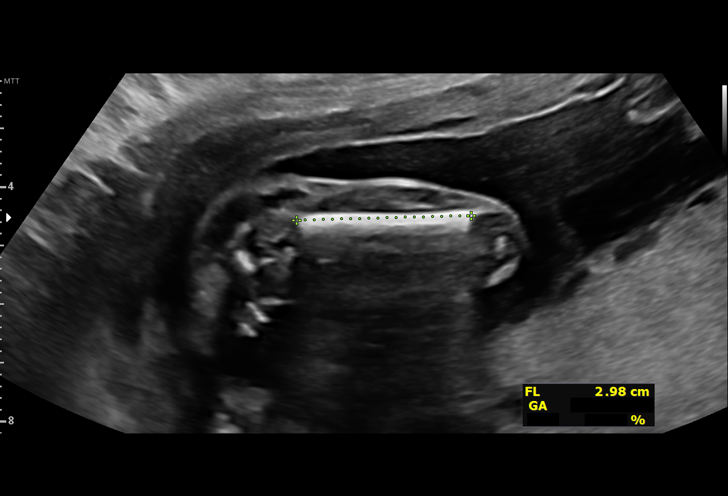
[im 85/115]
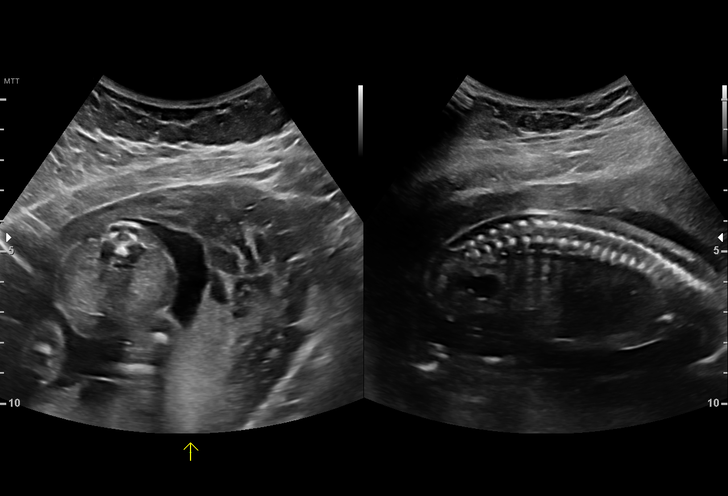
[im 93/115]
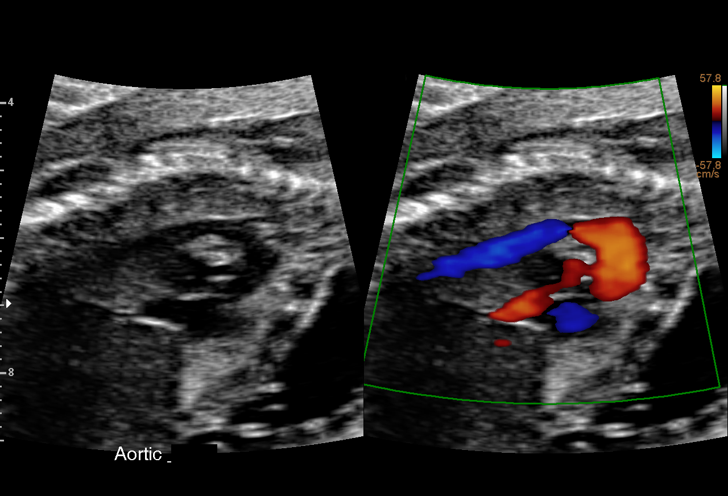
[im 102/115]
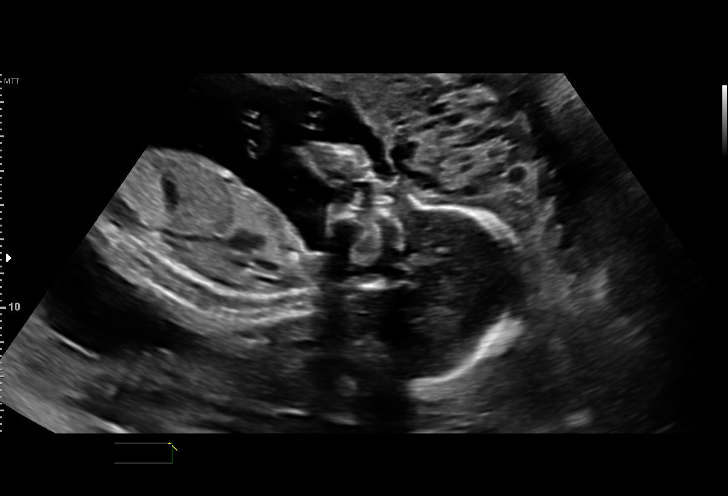
[im 110/115]
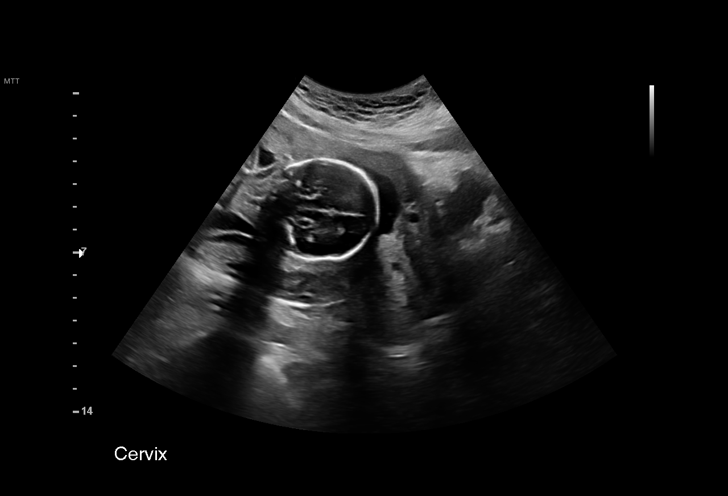

[13 of 28 positions shown; findings below may reference images not displayed]

Indications

 19 weeks gestation of pregnancy
 Advanced maternal age multigravida 34+,
 second trimester
 Encounter for antenatal screening for
 malformations
Fetal Evaluation

 Num Of Fetuses:         1
 Fetal Heart Rate(bpm):  148
 Cardiac Activity:       Observed
 Presentation:           Variable
 Placenta:               Posterior
 P. Cord Insertion:      Visualized, central

 Amniotic Fluid
 AFI FV:      Within normal limits

                             Largest Pocket(cm)

Biometry

 BPD:      43.3  mm     G. Age:  19w 1d         36  %    CI:        74.69   %    70 - 86
                                                         FL/HC:      18.4   %    16.1 -
 HC:       159   mm     G. Age:  18w 5d         14  %    HC/AC:      1.08        1.09 -
 AC:      147.3  mm     G. Age:  20w 0d         65  %    FL/BPD:     67.4   %
 FL:       29.2  mm     G. Age:  19w 0d         27  %    FL/AC:      19.8   %    20 - 24
 HUM:      28.4  mm     G. Age:  19w 1d         45  %
 CER:      19.1  mm     G. Age:  18w 5d         25  %
 NFT:       4.5  mm
 LV:        6.7  mm
 CM:        4.1  mm

 Est. FW:     293  gm    0 lb 10 oz      46  %
OB History

 Blood Type:   O+
 Gravidity:    2         Term:   1        Prem:   0        SAB:   0
 TOP:          0       Ectopic:  0        Living: 1
Gestational Age

 LMP:           19w 3d        Date:  02/20/20                 EDD:   11/26/20
 U/S Today:     19w 2d                                        EDD:   11/27/20
 Best:          19w 3d     Det. By:  LMP  (02/20/20)          EDD:   11/26/20
Anatomy

 Cranium:               Appears normal         Aortic Arch:            Appears normal
 Cavum:                 Appears normal         Ductal Arch:            Not well visualized
 Ventricles:            Appears normal         Diaphragm:              Appears normal
 Choroid Plexus:        Appears normal         Stomach:                Appears normal, left
                                                                       sided
 Cerebellum:            Appears normal         Abdomen:                Appears normal
 Posterior Fossa:       Appears normal         Abdominal Wall:         Appears nml (cord
                                                                       insert, abd wall)
 Nuchal Fold:           Appears normal         Cord Vessels:           Appears normal (3
                                                                       vessel cord)
 Face:                  Appears normal         Kidneys:                Appear normal
                        (orbits and profile)
 Lips:                  Appears normal         Bladder:                Appears normal
 Thoracic:              Appears normal         Spine:                  Appears normal
 Heart:                 Appears normal         Upper Extremities:      Appears normal
                        (4CH, axis, and
                        situs)
 RVOT:                  Appears normal         Lower Extremities:      Appears normal
 LVOT:                  Appears normal

 Other:  Fetus appears to be female. Nasal bone, Open hands, Heels, and 5th
         digit visualized.  Technically difficult due to fetal position.
Cervix Uterus Adnexa

 Cervix
 Length:           3.39  cm.
 Normal appearance by transabdominal scan.

 Uterus
 No abnormality visualized.

 Right Ovary
 Within normal limits. No adnexal mass visualized.

 Left Ovary
 Within normal limits. No adnexal mass visualized.
 Cul De Sac
 No free fluid seen.

 Adnexa
 No abnormality visualized.
Impression

 G2 P1.  Patient is here for fetal anatomy scan.  She had
 opted not to screen for fetal aneuploidies.  Patient reports no
 chronic medical conditions.
 Obstetric history significant for a term vaginal delivery.

 We performed fetal anatomy scan. No makers of
 aneuploidies or fetal structural defects are seen. Fetal
 biometry is consistent with her previously-established dates.
 Amniotic fluid is normal and good fetal activity is seen.
 Patient understands the limitations of ultrasound in detecting
 fetal anomalies.

Recommendations

 -An appointment was made for her to return in 4 weeks for
 completion of fetal anatomy (Ductal arch and revisit cardiac
 anatomy).
                 Bentz, Dre

## 2023-08-01 ENCOUNTER — Encounter: Payer: BC Managed Care – PPO | Admitting: Family Medicine

## 2023-08-04 ENCOUNTER — Encounter: Payer: BC Managed Care – PPO | Admitting: Family Medicine

## 2023-08-06 ENCOUNTER — Ambulatory Visit (INDEPENDENT_AMBULATORY_CARE_PROVIDER_SITE_OTHER): Payer: BC Managed Care – PPO | Admitting: Family Medicine

## 2023-08-06 ENCOUNTER — Encounter: Payer: Self-pay | Admitting: Family Medicine

## 2023-08-06 VITALS — BP 114/73 | HR 84 | Temp 99.0°F | Resp 18 | Ht 64.0 in | Wt 138.3 lb

## 2023-08-06 DIAGNOSIS — R7302 Impaired glucose tolerance (oral): Secondary | ICD-10-CM

## 2023-08-06 DIAGNOSIS — Z136 Encounter for screening for cardiovascular disorders: Secondary | ICD-10-CM

## 2023-08-06 DIAGNOSIS — Z13220 Encounter for screening for lipoid disorders: Secondary | ICD-10-CM

## 2023-08-06 DIAGNOSIS — Z0000 Encounter for general adult medical examination without abnormal findings: Secondary | ICD-10-CM | POA: Diagnosis not present

## 2023-08-06 NOTE — Progress Notes (Signed)
Complete physical exam  Patient: Kelsey Cole   DOB: 1985/12/08   37 y.o. Female  MRN: [id number]  Subjective:    Chief Complaint  Patient presents with   Annual Exam    Kelsey Cole is a 37 y.o. female who presents today for a complete physical exam. She reports consuming a  low carb/low sugar  diet. The patient does not participate in regular exercise at present. She generally feels well. She reports sleeping well. She does not have additional problems to discuss today.    Most recent fall risk assessment:    07/30/2022    9:55 AM  Fall Risk   Falls in the past year? 0  Number falls in past yr: 0  Injury with Fall? 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    Past Surgical History:  Procedure Laterality Date   TUBAL LIGATION N/A 11/20/2020   Procedure: POST PARTUM TUBAL LIGATION;  Surgeon: Venora Maples, MD;  Location: [facility];  Service: Gynecology;  Laterality: N/A;   Social History   Tobacco Use   Smoking status: Never   Smokeless tobacco: Never  Vaping Use   Vaping status: Never Used  Substance Use Topics   Alcohol use: Not Currently    Comment: RARELY   Drug use: Never   Family Status  Relation Name Status   Mother  Alive   Father  Alive   Sister  Alive   Brother  Alive   MGM  Alive   MGF  Deceased   PGM  Deceased   PGF  Deceased   Son Joselyn Glassman Alive   Oneal Grout  (Not Specified)   Dennie Bible Aunt  Deceased   Cousin  Deceased  No partnership data on  file   No Known Allergies    Patient Care Team: Suzan Slick, MD as PCP - General (Family Medicine) Zelphia Cairo, MD as Consulting Physician (Obstetrics and Gynecology)   Outpatient Medications Prior to Visit  Medication Sig   Multiple Vitamin (MULTIVITAMIN) tablet Take 1 tablet by mouth daily.   No facility-administered medications prior to visit.    Review of Systems  All other systems reviewed and are negative.        Objective:     BP 114/73   Pulse 84   Temp 99 F (37.2 C) (Oral)   Resp 18   Ht 5\' 4"  (1.626 m)   Wt 138 lb 4.8 oz (62.7 kg)   SpO2 100%   BMI 23.74 kg/m  BP Readings from Last 3 Encounters:  08/06/23 114/73  07/30/22 121/74  12/18/20 116/73      Physical Exam Vitals and nursing note reviewed.  Constitutional:      Appearance: Normal appearance. She is normal weight.  HENT:     Head: Normocephalic and atraumatic.     Right Ear: Tympanic membrane, ear canal and external ear normal.  Left Ear: Tympanic membrane, ear canal and external ear normal.     Nose: Nose normal.     Mouth/Throat:     Mouth: Mucous membranes are moist.     Pharynx: Oropharynx is clear.  Eyes:     Conjunctiva/sclera: Conjunctivae normal.     Pupils: Pupils are equal, round, and reactive to light.  Cardiovascular:     Rate and Rhythm: Normal rate and regular rhythm.     Pulses: Normal pulses.     Heart sounds: Normal heart sounds.  Pulmonary:     Effort: Pulmonary effort is normal.     Breath sounds: Normal breath sounds.  Abdominal:     General: Abdomen is flat. Bowel sounds are normal.  Skin:    General: Skin is warm.     Capillary Refill: Capillary refill takes less than 2 seconds.  Neurological:     General: No focal deficit present.     Mental Status: She is alert and oriented to person, place, and time. Mental status is at baseline.  Psychiatric:        Mood and Affect: Mood normal.        Behavior: Behavior normal.        Thought  Content: Thought content normal.        Judgment: Judgment normal.     No results found for any visits on 08/06/23. Last CBC Lab Results  Component Value Date   WBC 9.1 07/30/2022   HGB 13.6 07/30/2022   HCT 40.9 07/30/2022   MCV 90 07/30/2022   MCH 29.8 07/30/2022   RDW 12.2 07/30/2022   PLT 276 07/30/2022   Last metabolic panel Lab Results  Component Value Date   GLUCOSE 89 07/30/2022   NA 140 07/30/2022   K 4.1 07/30/2022   CL 103 07/30/2022   CO2 22 07/30/2022   BUN 13 07/30/2022   CREATININE 0.74 07/30/2022   EGFR 107 07/30/2022   CALCIUM 9.1 07/30/2022   PROT 7.3 07/30/2022   ALBUMIN 4.5 07/30/2022   LABGLOB 2.8 07/30/2022   AGRATIO 1.6 07/30/2022   BILITOT 0.3 07/30/2022   ALKPHOS 111 07/30/2022   AST 16 07/30/2022   ALT 7 07/30/2022   Last lipids Lab Results  Component Value Date   CHOL 187 07/30/2022   HDL 41 07/30/2022   LDLCALC 89 07/30/2022   TRIG 348 (H) 07/30/2022   CHOLHDL 4.6 (H) 07/30/2022   Last hemoglobin A1c Lab Results  Component Value Date   HGBA1C 5.2 07/30/2022        Assessment & Plan:    Routine Health Maintenance and Physical Exam  Immunization History  Administered Date(s) Administered   Influenza,inj,Quad PF,6+ Mos 07/08/2015, 07/22/2018, 06/26/2020   Influenza-Unspecified 05/26/2017, 06/26/2020, 07/27/2021   Tdap 05/11/2010, 09/29/2020    Health Maintenance  Topic Date Due   INFLUENZA VACCINE  04/03/2023   COVID-19 Vaccine (1 - 2023-24 season) Never done   Cervical Cancer Screening (HPV/Pap Cotest)  10/16/2023   DTaP/Tdap/Td (3 - Td or Tdap) 09/29/2030   Hepatitis C Screening  Completed   HIV Screening  Completed   HPV VACCINES  Aged Out    Discussed health benefits of physical activity, and encouraged her to engage in regular exercise appropriate for her age and condition.  Problem List Items Addressed This Visit   None  No follow-ups on file. Annual physical exam  Encounter for lipid screening for  cardiovascular disease -     Lipid panel  Impaired glucose tolerance -  CBC with Differential/Platelet -     Comprehensive metabolic panel -     Hemoglobin A1c   Screening labs See in 1 year sooner prn Declines vaccines    Suzan Slick, MD

## 2023-08-07 LAB — COMPREHENSIVE METABOLIC PANEL
ALT: 12 [IU]/L (ref 0–32)
AST: 14 [IU]/L (ref 0–40)
Albumin: 4.3 g/dL (ref 3.9–4.9)
Alkaline Phosphatase: 100 [IU]/L (ref 44–121)
BUN/Creatinine Ratio: 22 (ref 9–23)
BUN: 16 mg/dL (ref 6–20)
Bilirubin Total: 0.5 mg/dL (ref 0.0–1.2)
CO2: 21 mmol/L (ref 20–29)
Calcium: 9 mg/dL (ref 8.7–10.2)
Chloride: 103 mmol/L (ref 96–106)
Creatinine, Ser: 0.72 mg/dL (ref 0.57–1.00)
Globulin, Total: 2.9 g/dL (ref 1.5–4.5)
Glucose: 82 mg/dL (ref 70–99)
Potassium: 3.8 mmol/L (ref 3.5–5.2)
Sodium: 140 mmol/L (ref 134–144)
Total Protein: 7.2 g/dL (ref 6.0–8.5)
eGFR: 110 mL/min/{1.73_m2} (ref >59)

## 2023-08-07 LAB — CBC WITH DIFFERENTIAL/PLATELET
Basophils Absolute: 0 10*3/uL (ref 0.0–0.2)
Basos: 0 %
EOS (ABSOLUTE): 0.1 10*3/uL (ref 0.0–0.4)
Eos: 1 %
Hematocrit: 43.1 % (ref 34.0–46.6)
Hemoglobin: 13.7 g/dL (ref 11.1–15.9)
Immature Grans (Abs): 0 10*3/uL (ref 0.0–0.1)
Immature Granulocytes: 0 %
Lymphocytes Absolute: 3.3 10*3/uL — ABNORMAL HIGH (ref 0.7–3.1)
Lymphs: 31 %
MCH: 29.3 pg (ref 26.6–33.0)
MCHC: 31.8 g/dL (ref 31.5–35.7)
MCV: 92 fL (ref 79–97)
Monocytes Absolute: 0.5 10*3/uL (ref 0.1–0.9)
Monocytes: 5 %
Neutrophils Absolute: 6.5 10*3/uL (ref 1.4–7.0)
Neutrophils: 63 %
Platelets: 297 10*3/uL (ref 150–450)
RBC: 4.68 x10E6/uL (ref 3.77–5.28)
RDW: 12.6 % (ref 11.7–15.4)
WBC: 10.5 10*3/uL (ref 3.4–10.8)

## 2023-08-07 LAB — HEMOGLOBIN A1C
Est. average glucose Bld gHb Est-mCnc: 105 mg/dL
Hgb A1c MFr Bld: 5.3 % (ref 4.8–5.6)

## 2023-08-07 LAB — LIPID PANEL
Chol/HDL Ratio: 4.4 {ratio} (ref 0.0–4.4)
Cholesterol, Total: 223 mg/dL — ABNORMAL HIGH (ref 100–199)
HDL: 51 mg/dL (ref >39)
LDL Chol Calc (NIH): 142 mg/dL — ABNORMAL HIGH (ref 0–99)
Triglycerides: 165 mg/dL — ABNORMAL HIGH (ref 0–149)
VLDL Cholesterol Cal: 30 mg/dL (ref 5–40)

## 2024-08-09 ENCOUNTER — Encounter: Payer: BC Managed Care – PPO | Admitting: Family Medicine

## 2024-09-14 ENCOUNTER — Ambulatory Visit (INDEPENDENT_AMBULATORY_CARE_PROVIDER_SITE_OTHER): Admitting: Family Medicine

## 2024-09-14 ENCOUNTER — Encounter: Payer: Self-pay | Admitting: Family Medicine

## 2024-09-14 VITALS — BP 109/69 | HR 82 | Ht 64.0 in | Wt 144.0 lb

## 2024-09-14 DIAGNOSIS — Z0000 Encounter for general adult medical examination without abnormal findings: Secondary | ICD-10-CM | POA: Diagnosis not present

## 2024-09-14 DIAGNOSIS — Z23 Encounter for immunization: Secondary | ICD-10-CM

## 2024-09-14 DIAGNOSIS — Z13220 Encounter for screening for lipoid disorders: Secondary | ICD-10-CM

## 2024-09-14 DIAGNOSIS — Z136 Encounter for screening for cardiovascular disorders: Secondary | ICD-10-CM

## 2024-09-14 DIAGNOSIS — R7302 Impaired glucose tolerance (oral): Secondary | ICD-10-CM

## 2024-09-14 DIAGNOSIS — Z124 Encounter for screening for malignant neoplasm of cervix: Secondary | ICD-10-CM | POA: Diagnosis not present

## 2024-09-14 NOTE — Progress Notes (Signed)
 "  Complete physical exam  Patient: Kelsey Cole   DOB: [birth year]   39 y.o. Female  MRN: [id number]  Subjective:    Chief Complaint  Patient presents with   Annual Exam    YES PAP    Kelsey Cole is a 39 y.o. female who presents today for a complete physical exam. She reports consuming a general diet. The patient has a physically strenuous job, but has no regular exercise apart from work.  She generally feels well. She reports sleeping well. She does not have additional problems to discuss today.  Pt would like flu vaccine. Needs pap smear. Defers mammogram at this time, will be 39 next week.   Most recent fall risk assessment:    09/14/2024    9:05 AM  Fall Risk   Falls in the past year? 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          Objective:     BP 109/69 (BP Location: Left Arm, Patient Position: Sitting, Cuff Size: Normal)   Pulse 82   Ht 5' 4 (1.626 m)   Wt 144 lb (65.3 kg)   SpO2 99%   BMI 24.72 kg/m  BP Readings from Last 3 Encounters:  09/14/24 109/69  08/06/23 114/73  07/30/22 121/74      Physical Exam Vitals and nursing note reviewed. Exam conducted with a chaperone present.  Constitutional:      Appearance: Normal appearance. She is normal weight.  HENT:     Head: Normocephalic and atraumatic.     Right Ear: Tympanic membrane, ear canal and external ear normal.     Left Ear: Tympanic membrane, ear canal and external ear normal.     Nose: Nose normal.     Mouth/Throat:     Mouth: Mucous membranes are moist.     Pharynx: Oropharynx is clear.  Eyes:     Conjunctiva/sclera: Conjunctivae normal.     Pupils: Pupils are equal, round, and reactive to light.  Cardiovascular:     Rate and Rhythm: Normal rate and regular rhythm.  Pulses: Normal pulses.     Heart sounds: Normal heart sounds.  Pulmonary:     Effort: Pulmonary effort is normal.     Breath sounds: Normal breath sounds.  Abdominal:     General: Abdomen is flat. Bowel sounds are normal.  Genitourinary:    General: Normal vulva.     Rectum: Normal.  Skin:    General: Skin is warm.     Capillary Refill: Capillary refill takes less than 2 seconds.  Neurological:     General: No focal deficit present.     Mental Status: She is alert and oriented to person, place, and time. Mental status is at baseline.  Psychiatric:        Mood and Affect: Mood normal.        Behavior: Behavior normal.        Thought Content: Thought content normal.        Judgment: Judgment normal.     No results found for any visits on 09/14/24. Last CBC Lab Results  Component Value Date   WBC 10.5 08/06/2023   HGB 13.7 08/06/2023   HCT 43.1 08/06/2023   MCV 92 08/06/2023   MCH 29.3 08/06/2023   RDW 12.6 08/06/2023   PLT 297 08/06/2023   Last  metabolic panel Lab Results  Component Value Date   GLUCOSE 82 08/06/2023   NA 140 08/06/2023   K 3.8 08/06/2023   CL 103 08/06/2023   CO2 21 08/06/2023   BUN 16 08/06/2023   CREATININE 0.72 08/06/2023   EGFR 110 08/06/2023   CALCIUM 9.0 08/06/2023   PROT 7.2 08/06/2023   ALBUMIN 4.3 08/06/2023   LABGLOB 2.9 08/06/2023   AGRATIO 1.6 07/30/2022   BILITOT 0.5 08/06/2023   ALKPHOS 100 08/06/2023   AST 14 08/06/2023   ALT 12 08/06/2023   Last lipids Lab Results  Component Value Date   CHOL 223 (H) 08/06/2023   HDL 51 08/06/2023   LDLCALC 142 (H) 08/06/2023   TRIG 165 (H) 08/06/2023   CHOLHDL 4.4 08/06/2023   Last hemoglobin A1c Lab Results  Component Value Date   HGBA1C 5.3 08/06/2023        Assessment & Plan:    Routine Health Maintenance and Physical Exam  Immunization History  Administered Date(s) Administered   Influenza,inj,Quad PF,6+ Mos 07/08/2015, 07/22/2018, 06/26/2020   Influenza-Unspecified 05/26/2017, 06/26/2020, 07/27/2021   Tdap 05/11/2010, 09/29/2020    Health Maintenance  Topic Date Due   Hepatitis B Vaccines 19-59 Average Risk (1 of 3 - 19+ 3-dose series) Never done   HPV VACCINES (1 - 3-dose SCDM series) Never done   Cervical Cancer Screening (HPV/Pap Cotest)  10/16/2023   Influenza Vaccine  04/02/2024   COVID-19 Vaccine (1 - 2025-26 season) Never done   DTaP/Tdap/Td (3 - Td or Tdap) 09/29/2030   Hepatitis C Screening  Completed   HIV Screening  Completed   Pneumococcal Vaccine  Aged Out   Meningococcal B Vaccine  Aged Out    Discussed health benefits of physical activity, and encouraged her to engage in regular exercise appropriate for her age and condition.  Problem List Items Addressed This Visit   None Visit Diagnoses       Annual physical exam    -  Primary     Encounter for lipid screening for cardiovascular disease       Relevant Orders   Lipid panel     Impaired glucose tolerance       Relevant Orders  CBC with  Differential/Platelet   Comprehensive metabolic panel with GFR   Hemoglobin A1c     Screening for cervical cancer       Relevant Orders   IGP, Aptima HPV     Need for influenza vaccination       Relevant Orders   Flu vaccine trivalent PF, 6mos and older(Flulaval,Afluria,Fluarix,Fluzone)      Return in about 1 year (around 09/14/2025) for Annual Physical. Annual physical exam  Encounter for lipid screening for cardiovascular disease -     Lipid panel  Impaired glucose tolerance -     CBC with Differential/Platelet -     Comprehensive metabolic panel with GFR -     Hemoglobin A1c  Screening for cervical cancer -     IGP, Aptima HPV  Need for influenza vaccination -     Flu vaccine trivalent PF, 6mos and older(Flulaval,Afluria,Fluarix,Fluzone)   Screening labs and pap today Flu vaccine. See in 1 year sooner prn.     Torrence CINDERELLA Barrier, MD      [1]  Social History Tobacco Use   Smoking status: Never   Smokeless tobacco: Never  Vaping Use   Vaping status: Never Used  Substance Use Topics   Alcohol use: Not Currently    Comment: RARELY   Drug use: Never  [2] No Known Allergies [3]  Outpatient Medications Prior to Visit  Medication Sig   Multiple Vitamin (MULTIVITAMIN) tablet Take 1 tablet by mouth daily.   No facility-administered medications prior to visit.   "

## 2024-09-15 LAB — CBC WITH DIFFERENTIAL/PLATELET
Basophils Absolute: 0 x10E3/uL (ref 0.0–0.2)
Basos: 0 %
EOS (ABSOLUTE): 0.1 x10E3/uL (ref 0.0–0.4)
Eos: 1 %
Hematocrit: 40.1 % (ref 34.0–46.6)
Hemoglobin: 12.5 g/dL (ref 11.1–15.9)
Immature Grans (Abs): 0 x10E3/uL (ref 0.0–0.1)
Immature Granulocytes: 0 %
Lymphocytes Absolute: 3.8 x10E3/uL — ABNORMAL HIGH (ref 0.7–3.1)
Lymphs: 38 %
MCH: 28.6 pg (ref 26.6–33.0)
MCHC: 31.2 g/dL — ABNORMAL LOW (ref 31.5–35.7)
MCV: 92 fL (ref 79–97)
Monocytes Absolute: 0.4 x10E3/uL (ref 0.1–0.9)
Monocytes: 4 %
Neutrophils Absolute: 5.6 x10E3/uL (ref 1.4–7.0)
Neutrophils: 57 %
Platelets: 362 x10E3/uL (ref 150–450)
RBC: 4.37 x10E6/uL (ref 3.77–5.28)
RDW: 11.9 % (ref 11.7–15.4)
WBC: 10 x10E3/uL (ref 3.4–10.8)

## 2024-09-15 LAB — COMPREHENSIVE METABOLIC PANEL WITH GFR
ALT: 16 IU/L (ref 0–32)
AST: 16 IU/L (ref 0–40)
Albumin: 4.3 g/dL (ref 3.9–4.9)
Alkaline Phosphatase: 89 IU/L (ref 41–116)
BUN/Creatinine Ratio: 17 (ref 9–23)
BUN: 12 mg/dL (ref 6–20)
Bilirubin Total: 0.4 mg/dL (ref 0.0–1.2)
CO2: 23 mmol/L (ref 20–29)
Calcium: 8.9 mg/dL (ref 8.7–10.2)
Chloride: 102 mmol/L (ref 96–106)
Creatinine, Ser: 0.69 mg/dL (ref 0.57–1.00)
Globulin, Total: 2.9 g/dL (ref 1.5–4.5)
Glucose: 83 mg/dL (ref 70–99)
Potassium: 4 mmol/L (ref 3.5–5.2)
Sodium: 139 mmol/L (ref 134–144)
Total Protein: 7.2 g/dL (ref 6.0–8.5)
eGFR: 113 mL/min/1.73

## 2024-09-15 LAB — LIPID PANEL
Chol/HDL Ratio: 4.4 ratio (ref 0.0–4.4)
Cholesterol, Total: 234 mg/dL — ABNORMAL HIGH (ref 100–199)
HDL: 53 mg/dL
LDL Chol Calc (NIH): 156 mg/dL — ABNORMAL HIGH (ref 0–99)
Triglycerides: 141 mg/dL (ref 0–149)
VLDL Cholesterol Cal: 25 mg/dL (ref 5–40)

## 2024-09-15 LAB — HEMOGLOBIN A1C
Est. average glucose Bld gHb Est-mCnc: 105 mg/dL
Hgb A1c MFr Bld: 5.3 % (ref 4.8–5.6)

## 2024-09-16 ENCOUNTER — Ambulatory Visit: Payer: Self-pay | Admitting: Family Medicine

## 2024-09-16 LAB — IGP, APTIMA HPV
HPV Aptima: NEGATIVE
PAP Smear Comment: 0

## 2025-09-15 ENCOUNTER — Encounter: Admitting: Family Medicine
